# Patient Record
Sex: Female | Born: 1990 | Race: Black or African American | Hispanic: No | Marital: Married | Smoking: Never smoker
Health system: Southern US, Community
[De-identification: ages and names within clinical notes are randomized; demographics above are authoritative.]

## PROBLEM LIST (undated history)

## (undated) ENCOUNTER — Inpatient Hospital Stay: Payer: Self-pay

## (undated) DIAGNOSIS — N289 Disorder of kidney and ureter, unspecified: Secondary | ICD-10-CM

## (undated) DIAGNOSIS — I10 Essential (primary) hypertension: Secondary | ICD-10-CM

## (undated) HISTORY — PX: BLADDER SURGERY: SHX569

---
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
## 2012-01-01 ENCOUNTER — Inpatient Hospital Stay: Payer: Self-pay | Admitting: Obstetrics and Gynecology

## 2012-01-01 LAB — CBC WITH DIFFERENTIAL/PLATELET
Basophil %: 0.2 %
Eosinophil #: 0.1 10*3/uL (ref 0.0–0.7)
Eosinophil %: 1 %
HGB: 9.4 g/dL — ABNORMAL LOW (ref 12.0–16.0)
MCH: 28.1 pg (ref 26.0–34.0)
MCHC: 32.9 g/dL (ref 32.0–36.0)
MCV: 85 fL (ref 80–100)
Monocyte #: 0.7 10*3/uL (ref 0.0–0.7)
Monocyte %: 13.3 %
Neutrophil #: 3.2 10*3/uL (ref 1.4–6.5)
Neutrophil %: 63.1 %
RBC: 3.35 10*6/uL — ABNORMAL LOW (ref 3.80–5.20)

## 2012-04-21 ENCOUNTER — Emergency Department: Payer: Self-pay | Admitting: Emergency Medicine

## 2012-09-04 IMAGING — US US OB < 14 WEEKS
1 series · 17 of 28 positions shown · non-contrast
Comparison: none

REASON FOR EXAM: abdominal pain, vaginal bleeding
COMMENTS:

PROCEDURE:     US  - US OB LESS THAN 14 WEEKS  - June 02, 2011  [DATE]
RESULT:     First trimester OB ultrasound dated 06/02/2011.
Procedure: Real-time sonographic imaging the pelvis was obtained.

[Series 1: us ob < 14 weeks · 17 of 34 slices shown]
[im 1/34]
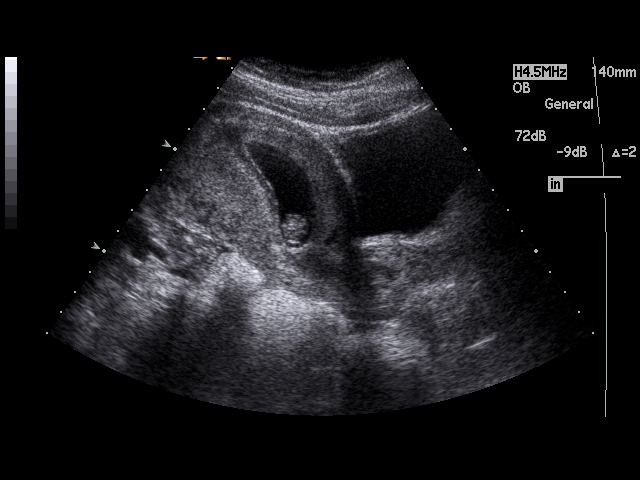
[im 3/34]
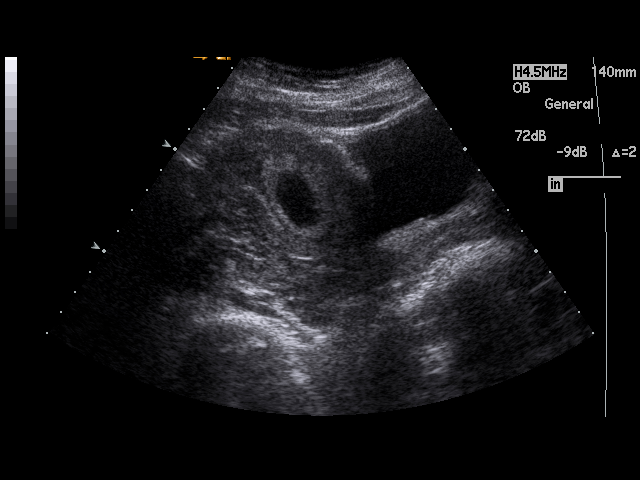
[im 5/34]
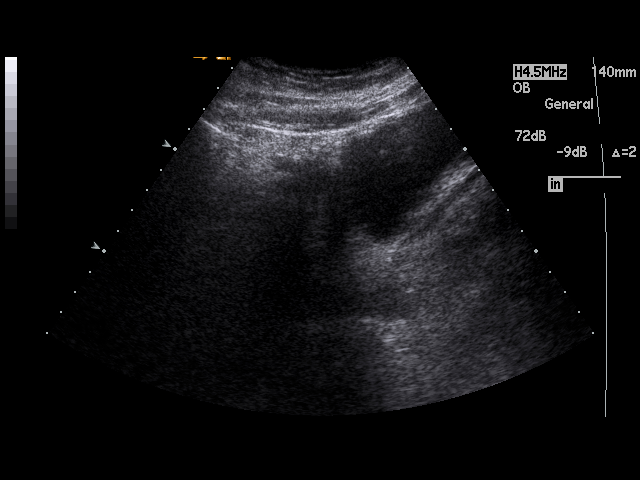
[im 7/34]
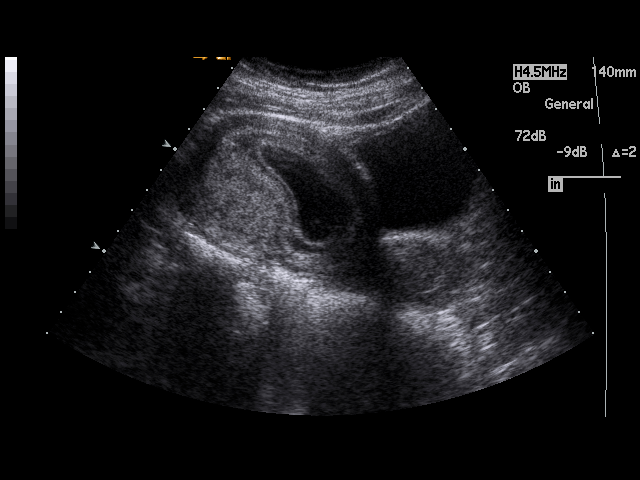
[im 9/34]
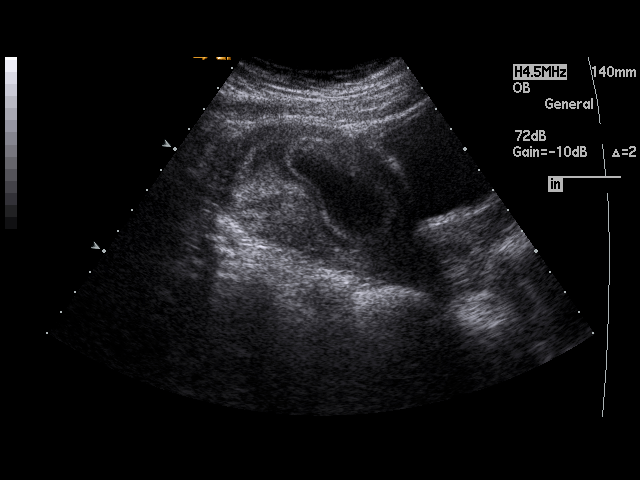
[im 12/34]
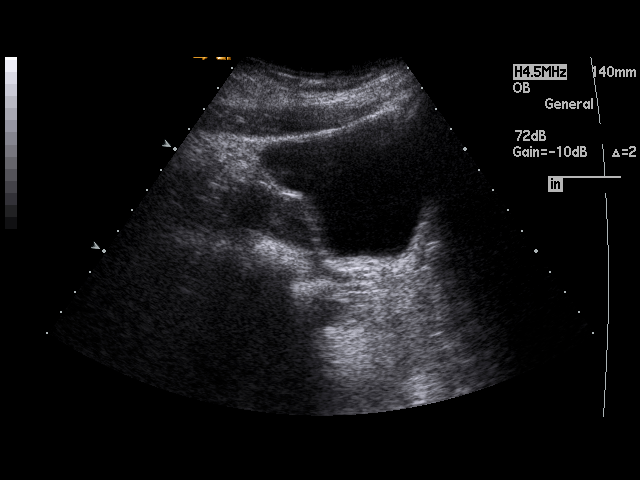
[im 13/34]
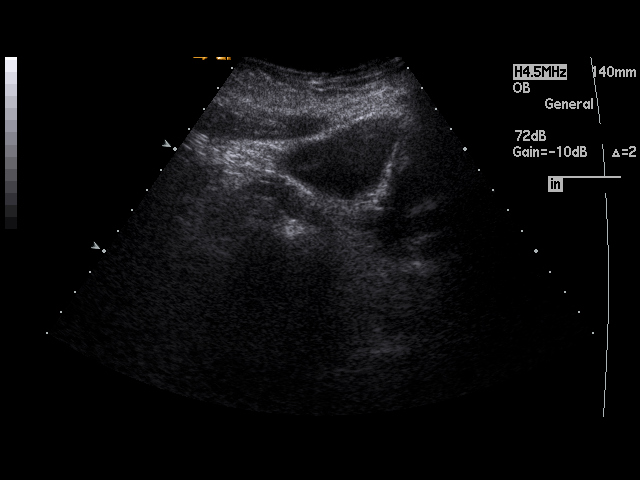
[im 15/34]
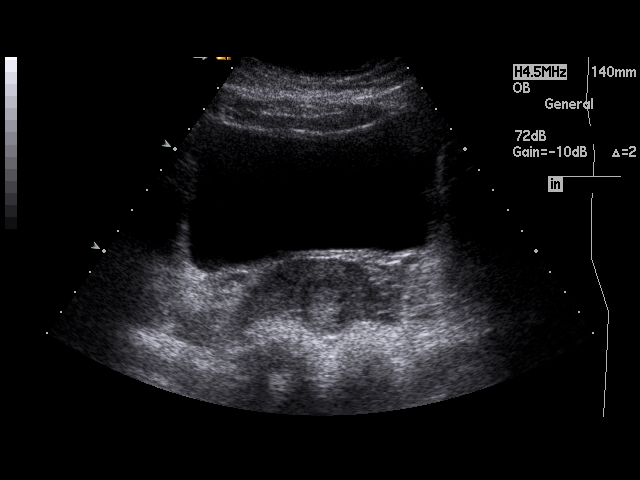
[im 18/34]
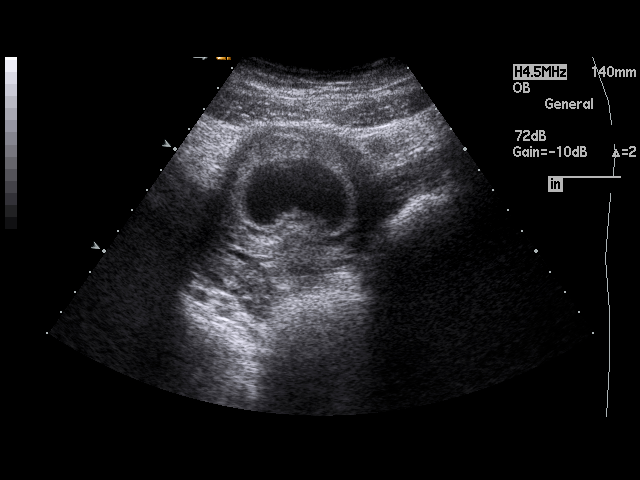
[im 19/34]
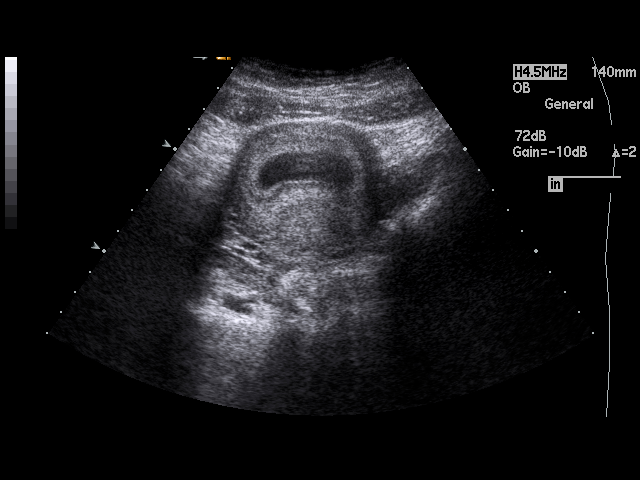
[im 21/34]
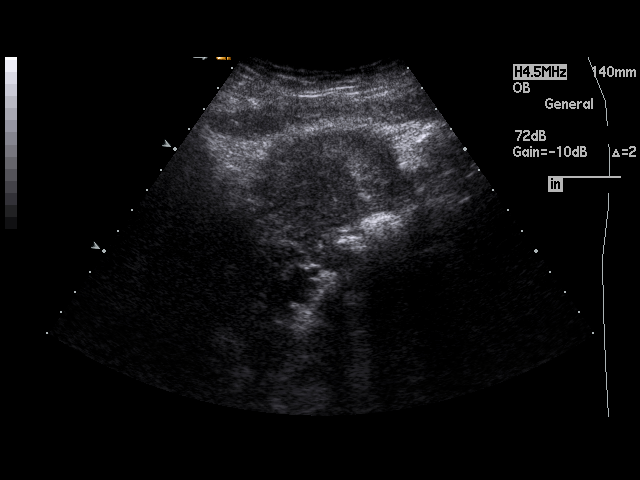
[im 23/34]
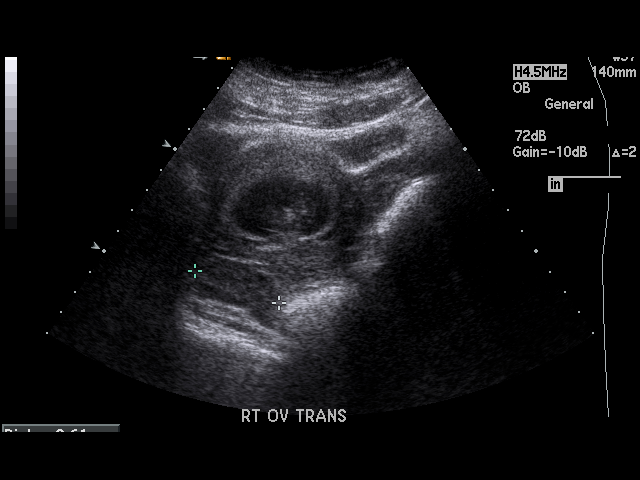
[im 25/34]
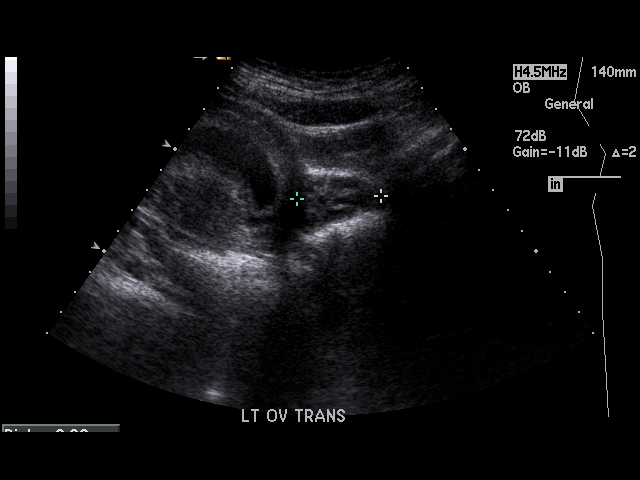
[im 27/34]
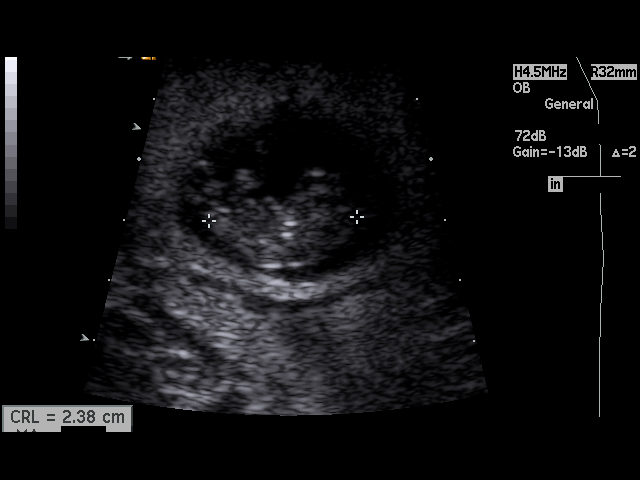
[im 29/34]
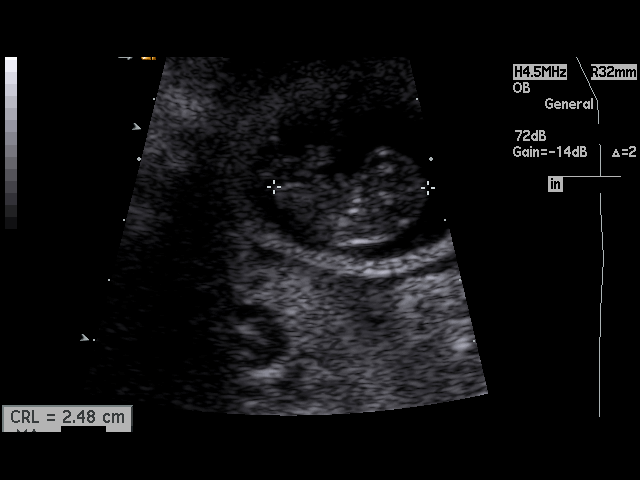
[im 31/34]
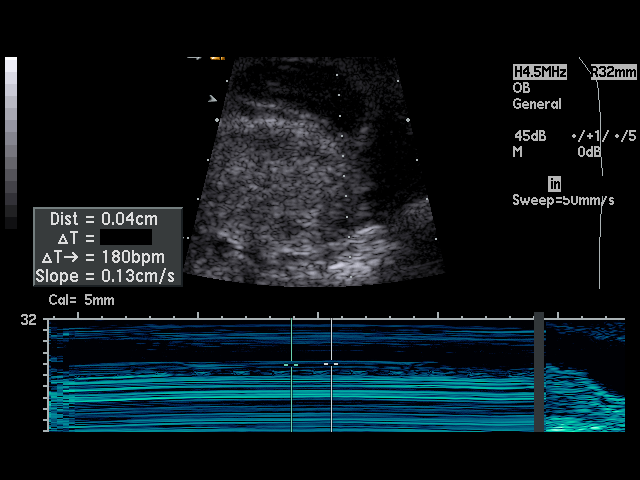
[im 34/34]
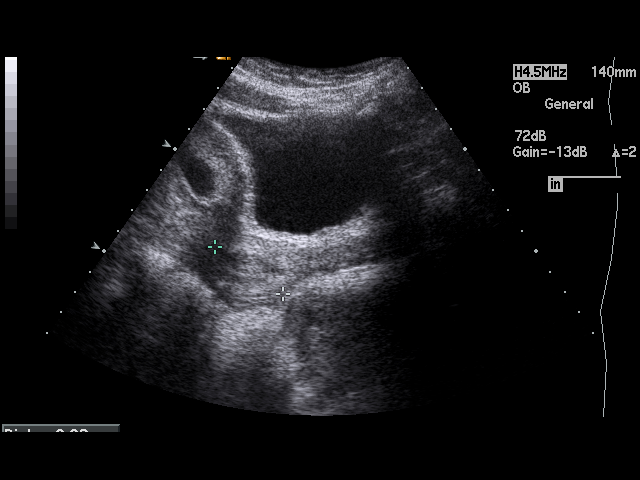

[17 of 28 positions shown; findings below may reference images not displayed]

FINDINGS: A single viable intrauterine pregnancy is identified within
estimated gestational age 9 weeks one day based upon crown-rump length of 2.
45 cm. Estimated fetal heart rate is 180 beats per minute. Yolk sacs
identified and appears unremarkable. The uterus otherwise demonstrates a
homogeneous echotexture. The right and left ovaries are unremarkable
demonstrating homogeneous echotexture. There is no evidence of pelvic free
fluid or loculated fluid collections.
IMPRESSION: Single viable intrauterine pregnancy as described above. No
sonographic and amount is identified.

## 2013-09-21 ENCOUNTER — Emergency Department: Payer: Self-pay | Admitting: Emergency Medicine

## 2013-09-21 LAB — COMPREHENSIVE METABOLIC PANEL
Albumin: 3.9 g/dL (ref 3.4–5.0)
Alkaline Phosphatase: 69 U/L
Anion Gap: 8 (ref 7–16)
Bilirubin,Total: 0.5 mg/dL (ref 0.2–1.0)
Calcium, Total: 9.2 mg/dL (ref 8.5–10.1)
Chloride: 103 mmol/L (ref 98–107)
EGFR (African American): >60
Glucose: 110 mg/dL — ABNORMAL HIGH (ref 65–99)
SGOT(AST): 39 U/L — ABNORMAL HIGH (ref 15–37)
SGPT (ALT): 40 U/L (ref 12–78)
Sodium: 138 mmol/L (ref 136–145)
Total Protein: 7.9 g/dL (ref 6.4–8.2)

## 2013-09-21 LAB — URINALYSIS, COMPLETE
Bacteria: NONE SEEN
Bilirubin,UR: NEGATIVE
Glucose,UR: NEGATIVE mg/dL (ref 0–75)
Nitrite: NEGATIVE
Ph: 6 (ref 4.5–8.0)
RBC,UR: 2 /HPF (ref 0–5)
Squamous Epithelial: 9
WBC UR: 27 /HPF (ref 0–5)

## 2013-09-21 LAB — CBC WITH DIFFERENTIAL/PLATELET
Basophil #: 0 10*3/uL (ref 0.0–0.1)
Basophil %: 0.2 %
Eosinophil #: 0.1 10*3/uL (ref 0.0–0.7)
Eosinophil %: 1.6 %
HCT: 36.8 % (ref 35.0–47.0)
Lymphocyte #: 1.1 10*3/uL (ref 1.0–3.6)
Lymphocyte %: 12.7 %
MCHC: 33 g/dL (ref 32.0–36.0)
MCV: 81 fL (ref 80–100)
Monocyte #: 0.6 x10 3/mm (ref 0.2–0.9)
Monocyte %: 7.6 %
Neutrophil #: 6.6 10*3/uL — ABNORMAL HIGH (ref 1.4–6.5)
Neutrophil %: 77.9 %
RBC: 4.56 10*6/uL (ref 3.80–5.20)
RDW: 15.3 % — ABNORMAL HIGH (ref 11.5–14.5)

## 2014-02-14 ENCOUNTER — Encounter: Payer: Self-pay | Admitting: General Surgery

## 2014-02-14 ENCOUNTER — Ambulatory Visit (INDEPENDENT_AMBULATORY_CARE_PROVIDER_SITE_OTHER): Payer: Medicaid Other | Admitting: General Surgery

## 2014-02-14 ENCOUNTER — Other Ambulatory Visit: Payer: Medicaid Other

## 2014-02-14 VITALS — BP 130/80 | HR 72 | Resp 14 | Ht 67.0 in | Wt 195.0 lb

## 2014-02-14 DIAGNOSIS — M79603 Pain in arm, unspecified: Secondary | ICD-10-CM

## 2014-02-14 DIAGNOSIS — M79609 Pain in unspecified limb: Secondary | ICD-10-CM

## 2014-02-14 NOTE — Progress Notes (Signed)
Patient ID: Crystal Patel, female   DOB: 05/04/1991, 23 y.o.   MRN: [id number]  Chief Complaint  Patient presents with  . Other    Nexplanon left arm     HPI Crystal Patel is a 23 y.o. female here today for a evaluation of removal of Nexplanon implant in left arm. No attempt has been made by her physician to remove this. The patient was referred by NP Lyndel PleasureErica Howard who felt that the implant was placed very deeply. Patient reports some pressure with the left arm. She has had her implant since July 2013. This was placed by Dr Patton SallesWeaver Lee two years ago. The wound has not had an additional unplanned pregnancy, but her family reports moodiness, attributed to the long-term birth control implant. The patient reports that she was never able to feel the implant after implantation.  The young woman reports no pain or discomfort in the upper extremity. No history of swelling or difficulty with motor function.  HPI  No past medical history on file.  No past surgical history on file.  No family history on file.  Social History History  Substance Use Topics  . Smoking status: Never Smoker   . Smokeless tobacco: Not on file  . Alcohol Use: No    No Known Allergies  Current Outpatient Prescriptions  Medication Sig Dispense Refill  . etonogestrel (IMPLANON) 68 MG IMPL implant Inject 1 each into the skin once.       No current facility-administered medications for this visit.    Review of Systems Review of Systems  Constitutional: Negative.   Respiratory: Negative.   Cardiovascular: Negative.     Blood pressure 130/80, pulse 72, resp. rate 14, height 5\' 7"  (1.702 m), weight 195 lb (88.451 kg), last menstrual period 12/22/2013.  Physical Exam Physical Exam  Constitutional: She is oriented to person, place, and time. She appears well-developed and well-nourished.  Neurological: She is alert and oriented to person, place, and time.  Skin:     No palpable abnormality of the upper extremity on  the left side is appreciated. A possible small implant site in the distal third of the upper arm showed no discernible thickening or palpable device.    Data Reviewed GYN procedure note and office notes.  Assessment    Lost implant.     Plan    The area of the left upper inner arm was carefully scant for the implant. Scanning was completed from the medial aspect of the upper arm to the upper third with no discernible architectural distortion appreciated. Normal venous anatomy was noted.   As a young woman is asymptomatic in regards to the implant itself, and it is unable to be identified on ultrasound, I would recommend observation alone. Based on review of the literature this implant will exhaust its medical component in the next 12 months. At that time she should discuss with her GYN provider options for management to prevent another unplanned pregnancy. The patient's mother was present for the interview and exam. At this time for an area that is asymptomatic except for "moodiness" I don't think additional imaging studies to identify the area or blind exploration is indicated.      Ref. MD: Lyndel PleasureErica Howard NP/Dr.Jackson    Merrily PewJeffrey W Yovani Cogburn 02/14/2014, 7:50 PM

## 2014-02-14 NOTE — Patient Instructions (Signed)
Call back with any problems

## 2014-08-22 ENCOUNTER — Encounter: Payer: Self-pay | Admitting: General Surgery

## 2015-02-28 NOTE — H&P (Signed)
L&D Evaluation:  History Expanded:   HPI 24 yo G1 with EDD of 12/31/11 per LMP. 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  Pt presents today at 37 weeks 4 days, c/o noting two small spots of vaginal bleeding this am at 0930. Had several episodes of vomiting yesterday and 1 episode this am. Denies regular ctx or SROM, good fetal movement. Labs: A negative, RI, VNI, HBsAg neg, RPR NR, HIV neg, GC/CT neg, GBS unknown    Patient's Medical History Hypertension    Patient's Surgical History bladder surgery    Medications Pre Natal Vitamins    Allergies NKDA    Social History none   Exam:   Vital Signs stable    General no apparent distress    Mental Status clear    Abdomen gravid, tender with contractions    Estimated Fetal Weight Average for gestational age    Fetal Position vertex    Edema no edema    Pelvic no external lesions, cervix closed and thick, no bleeding noted on perin    Mebranes Intact    FHT normal rate with no decels    Ucx irregular   Impression:   Impression Nausea/vomiting in pregnancy, no signs of vaginal bleeding   Plan:   Comments Give po Zofran and crackers Rx for Zofran reviewed labor and bleeding precautions   Electronic Signatures: Vella KohlerBrothers, [place] Freyre K (CNM)  (Signed 23-Feb-13 14:01)  Authored: L&D Evaluation   Last Updated: 23-Feb-13 14:01 by Vella KohlerBrothers, Zoriana Oats K (CNM)

## 2015-12-08 ENCOUNTER — Emergency Department
Admission: EM | Admit: 2015-12-08 | Discharge: 2015-12-09 | Disposition: A | Discharge status: Home / Self Care | Payer: BLUE CROSS/BLUE SHIELD | Attending: Emergency Medicine | Admitting: Emergency Medicine

## 2015-12-08 ENCOUNTER — Encounter: Payer: Self-pay | Admitting: Emergency Medicine

## 2015-12-08 DIAGNOSIS — I10 Essential (primary) hypertension: Secondary | ICD-10-CM | POA: Diagnosis not present

## 2015-12-08 DIAGNOSIS — R112 Nausea with vomiting, unspecified: Secondary | ICD-10-CM | POA: Insufficient documentation

## 2015-12-08 DIAGNOSIS — Z3202 Encounter for pregnancy test, result negative: Secondary | ICD-10-CM | POA: Diagnosis not present

## 2015-12-08 DIAGNOSIS — R197 Diarrhea, unspecified: Secondary | ICD-10-CM

## 2015-12-08 DIAGNOSIS — N390 Urinary tract infection, site not specified: Secondary | ICD-10-CM | POA: Diagnosis not present

## 2015-12-08 HISTORY — DX: Essential (primary) hypertension: I10

## 2015-12-08 LAB — COMPREHENSIVE METABOLIC PANEL
ALBUMIN: 4.3 g/dL (ref 3.5–5.0)
ALT: 30 U/L (ref 14–54)
ANION GAP: 8 (ref 5–15)
AST: 30 U/L (ref 15–41)
Alkaline Phosphatase: 60 U/L (ref 38–126)
BUN: 11 mg/dL (ref 6–20)
CHLORIDE: 104 mmol/L (ref 101–111)
CO2: 26 mmol/L (ref 22–32)
Calcium: 9.1 mg/dL (ref 8.9–10.3)
Creatinine, Ser: 0.71 mg/dL (ref 0.44–1.00)
GFR calc Af Amer: >60 mL/min (ref >60)
GFR calc non Af Amer: >60 mL/min (ref >60)
GLUCOSE: 96 mg/dL (ref 65–99)
POTASSIUM: 4 mmol/L (ref 3.5–5.1)
SODIUM: 138 mmol/L (ref 135–145)
TOTAL PROTEIN: 7.9 g/dL (ref 6.5–8.1)
Total Bilirubin: 0.6 mg/dL (ref 0.3–1.2)

## 2015-12-08 LAB — URINALYSIS COMPLETE WITH MICROSCOPIC (ARMC ONLY)
Bilirubin Urine: NEGATIVE
Glucose, UA: NEGATIVE mg/dL
HGB URINE DIPSTICK: NEGATIVE
Ketones, ur: NEGATIVE mg/dL
NITRITE: NEGATIVE
PROTEIN: NEGATIVE mg/dL
SPECIFIC GRAVITY, URINE: 1.023 (ref 1.005–1.030)
pH: 5 (ref 5.0–8.0)

## 2015-12-08 LAB — POCT PREGNANCY, URINE: PREG TEST UR: NEGATIVE

## 2015-12-08 LAB — CBC
HEMATOCRIT: 39.9 % (ref 35.0–47.0)
HEMOGLOBIN: 13.5 g/dL (ref 12.0–16.0)
MCH: 29 pg (ref 26.0–34.0)
MCHC: 33.9 g/dL (ref 32.0–36.0)
MCV: 85.5 fL (ref 80.0–100.0)
Platelets: 192 10*3/uL (ref 150–440)
RBC: 4.67 MIL/uL (ref 3.80–5.20)
RDW: 12.7 % (ref 11.5–14.5)
WBC: 8 10*3/uL (ref 3.6–11.0)

## 2015-12-08 MED ORDER — ONDANSETRON 4 MG PO TBDP
4.0000 mg | ORAL_TABLET | Freq: Once | ORAL | Status: AC | PRN
Start: 2015-12-08 — End: 2015-12-08
  Administered 2015-12-08: 4 mg via ORAL

## 2015-12-08 MED ORDER — ONDANSETRON 4 MG PO TBDP
ORAL_TABLET | ORAL | Status: AC
  Filled 2015-12-08: qty 1

## 2015-12-08 NOTE — ED Notes (Signed)
Pt states vomiting and diarrhea with abdominal pain that began at 1530 today. Pt states other sick contacts at work.

## 2015-12-09 MED ORDER — ONDANSETRON HCL 4 MG/2ML IJ SOLN
INTRAMUSCULAR | Status: AC
  Administered 2015-12-09: 4 mg via INTRAVENOUS
  Filled 2015-12-09: qty 2

## 2015-12-09 MED ORDER — ONDANSETRON 4 MG PO TBDP: 4.0000 mg | ORAL_TABLET | Freq: Three times a day (TID) | ORAL | Status: DC | PRN

## 2015-12-09 MED ORDER — SODIUM CHLORIDE 0.9 % IV BOLUS (SEPSIS)
1000.0000 mL | Freq: Once | INTRAVENOUS | Status: AC
  Administered 2015-12-09: 1000 mL via INTRAVENOUS

## 2015-12-09 MED ORDER — CIPROFLOXACIN HCL 500 MG PO TABS: 500.0000 mg | ORAL_TABLET | Freq: Two times a day (BID) | ORAL | Status: AC

## 2015-12-09 MED ORDER — ONDANSETRON HCL 4 MG/2ML IJ SOLN
4.0000 mg | Freq: Once | INTRAMUSCULAR | Status: AC
  Administered 2015-12-09: 4 mg via INTRAVENOUS

## 2015-12-09 NOTE — Discharge Instructions (Signed)

## 2015-12-09 NOTE — ED Provider Notes (Signed)
[facility] Emergency Department Provider Note  ____________________________________________  Time seen: 1:45 AM  I have reviewed the triage vital signs and the nursing notes.   HISTORY  Chief Complaint Emesis     HPI Crystal Patel is a 25 y.o. female presents with vomiting and diarrhea accompanied by abdominal cramps with onset at 1:30 PM yesterday afternoon. Patient admits to sick contacts at work with the same symptoms. Patient denies any fever. Patient denies any abdominal pain at this time. Patient denies any urinary symptoms    Past Medical History  Diagnosis Date  . Hypertension     Patient Active Problem List   Diagnosis Date Noted  . Arm pain 02/14/2014    Past Surgical History  Procedure Laterality Date  . Bladder surgery      Current Outpatient Rx  Name  Route  Sig  Dispense  Refill  . etonogestrel (IMPLANON) 68 MG IMPL implant   Subcutaneous   Inject 1 each into the skin once.         . ondansetron (ZOFRAN-ODT) 4 MG disintegrating tablet   Oral   Take 1 tablet (4 mg total) by mouth every 8 (eight) hours as needed for nausea.   20 tablet   0     Allergies No known drug allergies No family history on file.  Social History Social History  Substance Use Topics  . Smoking status: Never Smoker   . Smokeless tobacco: None  . Alcohol Use: No    Review of Systems  Constitutional: Negative for fever. Eyes: Negative for visual changes. ENT: Negative for sore throat. Cardiovascular: Negative for chest pain. Respiratory: Negative for shortness of breath. Gastrointestinal: Positive for vomiting and diarrhea Genitourinary: Negative for dysuria. Musculoskeletal: Negative for back pain. Skin: Negative for rash. Neurological: Negative for headaches, focal weakness or numbness.   10-point ROS otherwise negative.  ____________________________________________   PHYSICAL EXAM:  VITAL SIGNS: ED Triage Vitals  Enc  Vitals Group     BP 12/08/15 2138 139/81 mmHg     Pulse Rate 12/08/15 2138 90     Resp 12/08/15 2136 16     Temp 12/08/15 2138 98 F (36.7 C)     Temp Source 12/08/15 2138 Oral     SpO2 12/08/15 2138 100 %     Weight 12/08/15 2136 200 lb (90.719 kg)     Height 12/08/15 2136  (1.727 m)     Head Cir --      Peak Flow --      Pain Score 12/08/15 2136 5     Pain Loc --      Pain Edu? --      Excl. in GC? --      Constitutional: Alert and oriented. Well appearing and in no distress. Eyes: Conjunctivae are normal. PERRL. Normal extraocular movements. ENT   Head: Normocephalic and atraumatic.   Nose: No congestion/rhinnorhea.   Mouth/Throat: Mucous membranes are moist.   Neck: No stridor. Hematological/Lymphatic/Immunilogical: No cervical lymphadenopathy. Cardiovascular: Normal rate, regular rhythm. Normal and symmetric distal pulses are present in all extremities. No murmurs, rubs, or gallops. Respiratory: Normal respiratory effort without tachypnea nor retractions. Breath sounds are clear and equal bilaterally. No wheezes/rales/rhonchi. Gastrointestinal: Soft and nontender. No distention. There is no CVA tenderness. Genitourinary: deferred Musculoskeletal: Nontender with normal range of motion in all extremities. No joint effusions.  No lower extremity tenderness nor edema. Neurologic:  Normal speech and language. No gross focal neurologic deficits are appreciated. Speech is  normal.  Skin:  Skin is warm, dry and intact. No rash noted. Psychiatric: Mood and affect are normal. Speech and behavior are normal. Patient exhibits appropriate insight and judgment.  ____________________________________________    LABS (pertinent positives/negatives)  Labs Reviewed  URINALYSIS COMPLETEWITH MICROSCOPIC ([facility] ONLY) - Abnormal; Notable for the following:    Color, Urine YELLOW (*)    APPearance HAZY (*)    Leukocytes, UA 1+ (*)    Bacteria, UA RARE (*)    Squamous  Epithelial / LPF 6-30 (*)    All other components within normal limits  COMPREHENSIVE METABOLIC PANEL  CBC  POC URINE PREG, ED  POCT PREGNANCY, URINE        INITIAL IMPRESSION / ASSESSMENT AND PLAN / ED COURSE  Pertinent labs & imaging results that were available during my care of the patient were reviewed by me and considered in my medical decision making (see chart for details).  Urinalysis consistent with possible UTI. Patient will likely viral etiology for vomiting and diarrhea given sick contacts at same. Patient received IV Zofran 4 mg by mouth as well as 4 mg IV in the emergency department as well as 1 L normal saline IV  ____________________________________________   FINAL CLINICAL IMPRESSION(S) / ED DIAGNOSES  Final diagnoses:  Non-intractable vomiting with nausea, vomiting of unspecified type  Diarrhea, unspecified type  UTI (lower urinary tract infection)      Darci Current, MD 12/09/15 [phone]

## 2015-12-09 NOTE — ED Notes (Signed)
Resumed care from grace rn.  Pt alert. Iv infused.  Pt denies pain.  Family with pt.

## 2016-06-29 ENCOUNTER — Emergency Department
Admission: EM | Admit: 2016-06-29 | Discharge: 2016-06-29 | Disposition: A | Discharge status: Home / Self Care | Payer: BLUE CROSS/BLUE SHIELD | Attending: Emergency Medicine | Admitting: Emergency Medicine

## 2016-06-29 ENCOUNTER — Encounter: Payer: Self-pay | Admitting: *Deleted

## 2016-06-29 DIAGNOSIS — Z3A01 Less than 8 weeks gestation of pregnancy: Secondary | ICD-10-CM | POA: Insufficient documentation

## 2016-06-29 DIAGNOSIS — O219 Vomiting of pregnancy, unspecified: Secondary | ICD-10-CM | POA: Insufficient documentation

## 2016-06-29 DIAGNOSIS — I10 Essential (primary) hypertension: Secondary | ICD-10-CM | POA: Insufficient documentation

## 2016-06-29 DIAGNOSIS — O210 Mild hyperemesis gravidarum: Secondary | ICD-10-CM

## 2016-06-29 LAB — COMPREHENSIVE METABOLIC PANEL
ALK PHOS: 43 U/L (ref 38–126)
ALT: 22 U/L (ref 14–54)
ANION GAP: 6 (ref 5–15)
AST: 29 U/L (ref 15–41)
Albumin: 4.2 g/dL (ref 3.5–5.0)
BILIRUBIN TOTAL: 0.4 mg/dL (ref 0.3–1.2)
BUN: 9 mg/dL (ref 6–20)
CALCIUM: 9.6 mg/dL (ref 8.9–10.3)
CO2: 26 mmol/L (ref 22–32)
Chloride: 104 mmol/L (ref 101–111)
Creatinine, Ser: 0.73 mg/dL (ref 0.44–1.00)
GFR calc non Af Amer: >60 mL/min (ref >60)
Glucose, Bld: 117 mg/dL — ABNORMAL HIGH (ref 65–99)
Potassium: 3.7 mmol/L (ref 3.5–5.1)
Sodium: 136 mmol/L (ref 135–145)
TOTAL PROTEIN: 8.1 g/dL (ref 6.5–8.1)

## 2016-06-29 LAB — URINALYSIS COMPLETE WITH MICROSCOPIC (ARMC ONLY)
Bilirubin Urine: NEGATIVE
Glucose, UA: NEGATIVE mg/dL
Hgb urine dipstick: NEGATIVE
KETONES UR: NEGATIVE mg/dL
Leukocytes, UA: NEGATIVE
NITRITE: NEGATIVE
PROTEIN: NEGATIVE mg/dL
SPECIFIC GRAVITY, URINE: 1.008 (ref 1.005–1.030)
pH: 6 (ref 5.0–8.0)

## 2016-06-29 LAB — CBC
HCT: 37 % (ref 35.0–47.0)
HEMOGLOBIN: 12.7 g/dL (ref 12.0–16.0)
MCH: 29.7 pg (ref 26.0–34.0)
MCHC: 34.4 g/dL (ref 32.0–36.0)
MCV: 86.4 fL (ref 80.0–100.0)
Platelets: 217 10*3/uL (ref 150–440)
RBC: 4.29 MIL/uL (ref 3.80–5.20)
RDW: 13.2 % (ref 11.5–14.5)
WBC: 6.2 10*3/uL (ref 3.6–11.0)

## 2016-06-29 LAB — HCG, QUANTITATIVE, PREGNANCY: hCG, Beta Chain, Quant, S: 5343 m[IU]/mL — ABNORMAL HIGH (ref <5)

## 2016-06-29 LAB — POCT PREGNANCY, URINE: Preg Test, Ur: POSITIVE — AB

## 2016-06-29 MED ORDER — ONDANSETRON 4 MG PO TBDP: 4.0000 mg | ORAL_TABLET | Freq: Three times a day (TID) | ORAL | 0 refills | Status: AC | PRN

## 2016-06-29 NOTE — ED Triage Notes (Signed)
Pt reports she has nausea for 1 day.  Pt had positive home pregnancy last week.  No abd pain. No vag bleeding   No dysuria.  Pt alert.

## 2016-06-29 NOTE — ED Notes (Signed)
Pt states that she would like to know a due date on her pregnancy. Pt here for nausea x1 week.

## 2016-06-29 NOTE — Discharge Instructions (Signed)
Estimated due date: 02/21/2017

## 2016-06-29 NOTE — ED Provider Notes (Signed)
[facility] Emergency Department Provider Note  Time seen: 9:17 PM  I have reviewed the triage vital signs and the nursing notes.   HISTORY  Chief Complaint Morning Sickness    HPI Crystal Patel Crystal Patel is a 25 y.o. female with a past medical history of hypertension who presents the emergency department with nausea and pregnancy. According to the patient she is pregnant with a last menstrual period of July 28, presents with intermittent nausea and vomiting over the past 2 weeks. According to the patient she is nauseated most days but only vomits occasionally. She came to the emergency department for evaluation of the nausea, but also to find out what her due date was. Patient states her last period started July 28.Denies any abdominal pain. Denies vaginal bleeding or discharge.  Past Medical History:  Diagnosis Date  . Hypertension     Patient Active Problem List   Diagnosis Date Noted  . Arm pain 02/14/2014    Past Surgical History:  Procedure Laterality Date  . BLADDER SURGERY      Prior to Admission medications   Medication Sig Start Date End Date Taking? Authorizing Provider  etonogestrel (IMPLANON) 68 MG IMPL implant Inject 1 each into the skin once.    Historical Provider, MD  ondansetron (ZOFRAN-ODT) 4 MG disintegrating tablet Take 1 tablet (4 mg total) by mouth every 8 (eight) hours as needed for nausea. 12/09/15   Darci Currentandolph N Brown, MD    No Known Allergies  No family history on file.  Social History Social History  Substance Use Topics  . Smoking status: Never Smoker  . Smokeless tobacco: Never Used  . Alcohol use No    Review of Systems Constitutional: Negative for fever. Cardiovascular: Negative for chest pain. Respiratory: Negative for shortness of breath. Gastrointestinal: Negative for abdominal pain. Positive for nausea and vomiting. Negative for diarrhea. Genitourinary: Negative for dysuria. Neurological: Negative for  headache 10-point ROS otherwise negative.  ____________________________________________   PHYSICAL EXAM:  VITAL SIGNS: ED Triage Vitals [06/29/16 1753]  Enc Vitals Group     BP (!) 141/75     Pulse Rate 85     Resp 18     Temp 98.2 F (36.8 C)     Temp Source Oral     SpO2 98 %     Weight 211 lb (95.7 kg)     Height 5\' 7"  (1.702 m)     Head Circumference      Peak Flow      Pain Score      Pain Loc      Pain Edu?      Excl. in GC?     Constitutional: Alert and oriented. Well appearing and in no distress. Eyes: Normal exam ENT   Head: Normocephalic and atraumatic.   Mouth/Throat: Mucous membranes are moist. Cardiovascular: Normal rate, regular rhythm. No murmur Respiratory: Normal respiratory effort without tachypnea nor retractions. Breath sounds are clear Gastrointestinal: Soft and nontender. No distention.   Musculoskeletal: Nontender with normal range of motion in all extremities. Neurologic:  Normal speech and language. No gross focal neurologic deficits Skin:  Skin is warm, dry and intact.  Psychiatric: Mood and affect are normal.   ____________________________________________   INITIAL IMPRESSION / ASSESSMENT AND PLAN / ED COURSE  Pertinent labs & imaging results that were available during my care of the patient were reviewed by me and considered in my medical decision making (see chart for details).  The patient presents to the emergency  department with intermittent nausea and vomiting, trying to find out what her due date is. Patient's last period started July 28, which would place her due date at 02/21/2017. Overall the patient appears well, labs are largely within normal limits, nontender abdominal exam. I discussed the pros and cons including cleft lip/cleft palate risk of Zofran, patient is agreeable to Zofran prescription which she will take sparingly. She will call to arrange an OB appointment as soon as  possible.  ____________________________________________   FINAL CLINICAL IMPRESSION(S) / ED DIAGNOSES  Morning sickness    [name], MD 06/29/16 2120

## 2016-07-11 ENCOUNTER — Emergency Department
Admission: EM | Admit: 2016-07-11 | Discharge: 2016-07-11 | Disposition: A | Discharge status: Home / Self Care | Payer: BLUE CROSS/BLUE SHIELD | Attending: Emergency Medicine | Admitting: Emergency Medicine

## 2016-07-11 ENCOUNTER — Emergency Department: Payer: BLUE CROSS/BLUE SHIELD

## 2016-07-11 ENCOUNTER — Encounter: Payer: Self-pay | Admitting: Emergency Medicine

## 2016-07-11 DIAGNOSIS — O209 Hemorrhage in early pregnancy, unspecified: Secondary | ICD-10-CM | POA: Diagnosis not present

## 2016-07-11 DIAGNOSIS — Z3A01 Less than 8 weeks gestation of pregnancy: Secondary | ICD-10-CM | POA: Insufficient documentation

## 2016-07-11 DIAGNOSIS — I10 Essential (primary) hypertension: Secondary | ICD-10-CM | POA: Insufficient documentation

## 2016-07-11 DIAGNOSIS — N939 Abnormal uterine and vaginal bleeding, unspecified: Secondary | ICD-10-CM | POA: Diagnosis present

## 2016-07-11 LAB — CBC WITH DIFFERENTIAL/PLATELET
BASOS ABS: 0 10*3/uL (ref 0–0.1)
Basophils Relative: 0 %
EOS PCT: 4 %
Eosinophils Absolute: 0.3 10*3/uL (ref 0–0.7)
HEMATOCRIT: 35.1 % (ref 35.0–47.0)
Hemoglobin: 12.2 g/dL (ref 12.0–16.0)
LYMPHS PCT: 30 %
Lymphs Abs: 2 10*3/uL (ref 1.0–3.6)
MCH: 29.9 pg (ref 26.0–34.0)
MCHC: 34.8 g/dL (ref 32.0–36.0)
MCV: 85.8 fL (ref 80.0–100.0)
Monocytes Absolute: 0.9 10*3/uL (ref 0.2–0.9)
Monocytes Relative: 14 %
NEUTROS ABS: 3.4 10*3/uL (ref 1.4–6.5)
Neutrophils Relative %: 52 %
PLATELETS: 211 10*3/uL (ref 150–440)
RBC: 4.09 MIL/uL (ref 3.80–5.20)
RDW: 13.4 % (ref 11.5–14.5)
WBC: 6.5 10*3/uL (ref 3.6–11.0)

## 2016-07-11 LAB — URINALYSIS COMPLETE WITH MICROSCOPIC (ARMC ONLY)
Bacteria, UA: NONE SEEN
Bilirubin Urine: NEGATIVE
Glucose, UA: NEGATIVE mg/dL
HGB URINE DIPSTICK: NEGATIVE
KETONES UR: NEGATIVE mg/dL
LEUKOCYTES UA: NEGATIVE
Nitrite: NEGATIVE
PH: 7 (ref 5.0–8.0)
PROTEIN: NEGATIVE mg/dL
Specific Gravity, Urine: 1.015 (ref 1.005–1.030)

## 2016-07-11 LAB — ABO/RH: ABO/RH(D): A NEG

## 2016-07-11 LAB — HCG, QUANTITATIVE, PREGNANCY: hCG, Beta Chain, Quant, S: 10233 m[IU]/mL — ABNORMAL HIGH (ref <5)

## 2016-07-11 LAB — ANTIBODY SCREEN: Antibody Screen: NEGATIVE

## 2016-07-11 MED ORDER — RHO D IMMUNE GLOBULIN 1500 UNIT/2ML IJ SOSY
50.0000 ug | PREFILLED_SYRINGE | Freq: Once | INTRAMUSCULAR | Status: AC
  Administered 2016-07-11: 49.5 ug via INTRAMUSCULAR
  Filled 2016-07-11: qty 2

## 2016-07-11 NOTE — ED Provider Notes (Signed)
[facility] Emergency Department Provider Note  ____________________________________________   I have reviewed the triage vital signs and the nursing notes.   HISTORY  Chief Complaint Vaginal Bleeding    HPI Crystal Patel is a 25 y.o. female last menstrual period was July 26 making her approximately [redacted] weeks pregnant presents today complaining of spotting. She had trace spotting over the course of the day. Denies dysuria or urinary frequency denies any pelvic pain. Patient was seen here on the ninth with some hyperemesis symptoms, which have resolved. At that time patient wanted an ultrasound.Patient has no other complaints or symptoms.      Past Medical History:  Diagnosis Date  . Hypertension     Patient Active Problem List   Diagnosis Date Noted  . Arm pain 02/14/2014    Past Surgical History:  Procedure Laterality Date  . BLADDER SURGERY      Prior to Admission medications   Medication Sig Start Date End Date Taking? Authorizing Provider  etonogestrel (IMPLANON) 68 MG IMPL implant Inject 1 each into the skin once.    Historical Provider, MD  ondansetron (ZOFRAN ODT) 4 MG disintegrating tablet Take 1 tablet (4 mg total) by mouth every 8 (eight) hours as needed for nausea or vomiting. 06/29/16   Minna AntisKevin Paduchowski, MD    Allergies Review of patient's allergies indicates no known allergies.  History reviewed. No pertinent family history.  Social History Social History  Substance Use Topics  . Smoking status: Never Smoker  . Smokeless tobacco: Never Used  . Alcohol use No    Review of Systems Constitutional: No fever/chills Eyes: No visual changes. ENT: No sore throat. No stiff neck no neck pain Cardiovascular: Denies chest pain. Respiratory: Denies shortness of breath. Gastrointestinal:   no vomiting.  No diarrhea.  No constipation. Genitourinary: Negative for dysuria. Musculoskeletal: Negative lower extremity swelling Skin:  Negative for rash. Neurological: Negative for severe headaches, focal weakness or numbness. 10-point ROS otherwise negative.  ____________________________________________   PHYSICAL EXAM:  VITAL SIGNS: ED Triage Vitals [07/11/16 1815]  Enc Vitals Group     BP (!) 143/69     Pulse Rate 78     Resp 18     Temp 98.2 F (36.8 C)     Temp Source Oral     SpO2 100 %     Weight 213 lb (96.6 kg)     Height 5\' 8"  (1.727 m)     Head Circumference      Peak Flow      Pain Score      Pain Loc      Pain Edu?      Excl. in GC?     Constitutional: Alert and oriented. Well appearing and in no acute distress. Eyes: Conjunctivae are normal. PERRL. EOMI. Head: Atraumatic. Nose: No congestion/rhinnorhea. Mouth/Throat: Mucous membranes are moist.  Oropharynx non-erythematous. Cardiovascular: Normal rate, regular rhythm. Grossly normal heart sounds.  Good peripheral circulation. Respiratory: Normal respiratory effort.  No retractions. Lungs CTAB. Abdominal: Soft and nontender. No distention. No guarding no rebound Back:  There is no focal tenderness or step off.  there is no midline tenderness there are no lesions noted. there is no CVA tenderness Pelvic exam: Female nurse chaperone present, no external lesions noted, physiologic vaginal discharge noted with no purulent discharge, no cervical motion tenderness, no adnexal tenderness or mass, there is no significant uterine tenderness or mass. No vaginal bleeding Musculoskeletal: No lower extremity tenderness, no upper extremity tenderness. No  joint effusions, no DVT signs strong distal pulses no edema Neurologic:  Normal speech and language. No gross focal neurologic deficits are appreciated.  Skin:  Skin is warm, dry and intact. No rash noted. Psychiatric: Mood and affect are normal. Speech and behavior are normal.  ____________________________________________   LABS (all labs ordered are listed, but only abnormal results are  displayed)  Labs Reviewed  HCG, QUANTITATIVE, PREGNANCY - Abnormal; Notable for the following:       Result Value   hCG, Beta Chain, Quant, S 10,233 (*)    All other components within normal limits  CBC WITH DIFFERENTIAL/PLATELET  URINALYSIS COMPLETEWITH MICROSCOPIC ([facility] ONLY)  ABO/RH  ANTIBODY SCREEN  RHOGAM INJECTION   ____________________________________________  EKG  I personally interpreted any EKGs ordered by me or triage  ____________________________________________  RADIOLOGY  I reviewed any imaging ordered by me or triage that were performed during my shift and, if possible, patient and/or family made aware of any abnormal findings. ____________________________________________   PROCEDURES  Procedure(s) performed: None  Procedures  Critical Care performed: None  ____________________________________________   INITIAL IMPRESSION / ASSESSMENT AND PLAN / ED COURSE  Pertinent labs & imaging results that were available during my care of the patient were reviewed by me and considered in my medical decision making (see chart for details).  Patient with spotting which I do not see an early pregnancy. Ultrasound shows an early IUP which is not exactly concordant with dates however there is no adnexal tenderness, no significant vaginal bleeding, and no ectopic pregnancy seen on exam. Low suspicion for serious sac therefore. Nonetheless I have strongly advised that she follow up with [facility], her preferred OB in 2 days for further evaluation. Extensive return precautions and follow-up given for any bleeding or abdominal pain as per my customary instructions. I have also ordered program, she has no evidence of ongoing bleeding but she was having spotting she is certainly Rh- and that will be administered as well. At this stage of pregnancy her condition is for 50 g which I have ordered.  Clinical Course   ____________________________________________   FINAL  CLINICAL IMPRESSION(S) / ED DIAGNOSES  Final diagnoses:  None      This chart was dictated using voice recognition software.  Despite best efforts to proofread,  errors can occur which can change meaning.      Jeanmarie Plant, MD 07/11/16 2220

## 2016-07-11 NOTE — ED Triage Notes (Signed)
Pt reports 2 months pregnant and started spotting today at work. No abdominal pain. Has seen doctor. US for IUP verification next week.

## 2016-07-12 LAB — RHOGAM INJECTION: UNIT DIVISION: 0

## 2016-07-17 ENCOUNTER — Emergency Department: Payer: BLUE CROSS/BLUE SHIELD

## 2016-07-17 ENCOUNTER — Encounter: Payer: Self-pay | Admitting: Emergency Medicine

## 2016-07-17 ENCOUNTER — Emergency Department
Admission: EM | Admit: 2016-07-17 | Discharge: 2016-07-17 | Disposition: A | Discharge status: Home / Self Care | Payer: BLUE CROSS/BLUE SHIELD | Attending: Emergency Medicine | Admitting: Emergency Medicine

## 2016-07-17 DIAGNOSIS — Z3A01 Less than 8 weeks gestation of pregnancy: Secondary | ICD-10-CM | POA: Diagnosis not present

## 2016-07-17 DIAGNOSIS — I10 Essential (primary) hypertension: Secondary | ICD-10-CM | POA: Diagnosis not present

## 2016-07-17 DIAGNOSIS — R1030 Lower abdominal pain, unspecified: Secondary | ICD-10-CM | POA: Diagnosis not present

## 2016-07-17 DIAGNOSIS — O209 Hemorrhage in early pregnancy, unspecified: Secondary | ICD-10-CM | POA: Insufficient documentation

## 2016-07-17 LAB — CBC
HCT: 36.8 % (ref 35.0–47.0)
Hemoglobin: 12.6 g/dL (ref 12.0–16.0)
MCH: 29.6 pg (ref 26.0–34.0)
MCHC: 34.4 g/dL (ref 32.0–36.0)
MCV: 86.2 fL (ref 80.0–100.0)
Platelets: 196 10*3/uL (ref 150–440)
RBC: 4.27 MIL/uL (ref 3.80–5.20)
RDW: 12.9 % (ref 11.5–14.5)
WBC: 4.3 10*3/uL (ref 3.6–11.0)

## 2016-07-17 LAB — HCG, QUANTITATIVE, PREGNANCY: hCG, Beta Chain, Quant, S: 13431 m[IU]/mL — ABNORMAL HIGH (ref <5)

## 2016-07-17 NOTE — ED Triage Notes (Addendum)
Pt reports vaginal bleeding only when wiping. Pt is two mths pregnant with mild cramping started yesterday. [facility] and did have a pap smear completed.

## 2016-07-17 NOTE — ED Provider Notes (Signed)
[facility] Emergency Department Provider Note  ____________________________________________  Time seen: Approximately 8:11 AM  I have reviewed the triage vital signs and the nursing notes.   HISTORY  Chief Complaint Vaginal Bleeding   HPI Crystal Patel is a 25 y.o. female G2P1 currently at [redacted] weeks gestational age who presents for evaluation of abdominal cramping and vaginal spotting. Patient was here a week ago and had an ultrasound showing a 5 week intrauterine gestational sac with no embryo or yolk sac visualized. She reports that the vaginal spotting subsided. Yesterday she followed up with OB/GYN and had a Pap smear done. Yesterday evening she started having lower abdominal cramping and started spotting. She reports that she only has blood when she wipes but she has passed a few blood clots. She denies syncope, lightheadedness, chest pain, shortness of breath. She currently has mild lower abdominal cramping that radiates to her back since last night. She tried some Tylenol at home with no relief of the pain.  Past Medical History:  Diagnosis Date  . Hypertension     Patient Active Problem List   Diagnosis Date Noted  . Arm pain 02/14/2014    Past Surgical History:  Procedure Laterality Date  . BLADDER SURGERY      Prior to Admission medications   Medication Sig Start Date End Date Taking? Authorizing Provider  etonogestrel (IMPLANON) 68 MG IMPL implant Inject 1 each into the skin once.    Historical Provider, MD  ondansetron (ZOFRAN ODT) 4 MG disintegrating tablet Take 1 tablet (4 mg total) by mouth every 8 (eight) hours as needed for nausea or vomiting. 06/29/16   [name], MD    Allergies Review of patient's allergies indicates no known allergies.  No family history on file.  Social History Social History  Substance Use Topics  . Smoking status: Never Smoker  . Smokeless tobacco: Never Used  . Alcohol use No    Review of  Systems  Constitutional: Negative for fever. Eyes: Negative for visual changes. ENT: Negative for sore throat. Cardiovascular: Negative for chest pain. Respiratory: Negative for shortness of breath. Gastrointestinal: + lower abdominal cramping. No vomiting or diarrhea. Genitourinary: Negative for dysuria. + vaginal bleeding Musculoskeletal: Negative for back pain. Skin: Negative for rash. Neurological: Negative for headaches, weakness or numbness.  ____________________________________________   PHYSICAL EXAM:  VITAL SIGNS: ED Triage Vitals  Enc Vitals Group     BP 07/17/16 0743 130/80     Pulse Rate 07/17/16 0743 79     Resp 07/17/16 0743 18     Temp 07/17/16 0743 98.2 F (36.8 C)     Temp Source 07/17/16 0743 Oral     SpO2 07/17/16 0743 99 %     Weight 07/17/16 0744 214 lb (97.1 kg)     Height 07/17/16 0744 5\' 7"  (1.702 m)     Head Circumference --      Peak Flow --      Pain Score 07/17/16 0744 2     Pain Loc --      Pain Edu? --      Excl. in GC? --     Constitutional: Alert and oriented. Well appearing and in no apparent distress. HEENT:      Head: Normocephalic and atraumatic.         Eyes: Conjunctivae are normal. Sclera is non-icteric. EOMI. PERRL      Mouth/Throat: Mucous membranes are moist.       Neck: Supple with no signs  of meningismus. Cardiovascular: Regular rate and rhythm. No murmurs, gallops, or rubs. 2+ symmetrical distal pulses are present in all extremities. No JVD. Respiratory: Normal respiratory effort. Lungs are clear to auscultation bilaterally. No wheezes, crackles, or rhonchi.  Gastrointestinal: Soft, non tender, and non distended with positive bowel sounds. No rebound or guarding. Genitourinary: No CVA tenderness. Pelvic exam: Normal external genitalia, no rashes or lesions. Normal cervical mucus. Os closed. Small amount of blood in the vaginal vault.No cervical motion tenderness.  No uterine or adnexal tenderness.   Musculoskeletal:  Nontender with normal range of motion in all extremities. No edema, cyanosis, or erythema of extremities. Neurologic: Normal speech and language. Face is symmetric. Moving all extremities. No gross focal neurologic deficits are appreciated. Skin: Skin is warm, dry and intact. No rash noted. Psychiatric: Mood and affect are normal. Speech and behavior are normal.  ____________________________________________   LABS (all labs ordered are listed, but only abnormal results are displayed)  Labs Reviewed  HCG, QUANTITATIVE, PREGNANCY - Abnormal; Notable for the following:       Result Value   hCG, Beta Chain, Quant, S 13,431 (*)    All other components within normal limits  CBC   ____________________________________________  EKG  none ____________________________________________  RADIOLOGY  [place]:  Gestational sac is present within the uterine body with minimal increase in size from examination performed 6 days ago. No yolk sac or embryo. Endometrial heterogeneity and thickening. No subchorionic hemorrhage. While an early intrauterine pregnancy can have this appearance, findings are worrisome for impending non viability. Ectopic pregnancy is not excluded. Correlation with beta HCG and short-term repeat ultrasound may be helpful in further evaluation. ____________________________________________   PROCEDURES  Procedure(s) performed: None Procedures Critical Care performed:  None ____________________________________________   INITIAL IMPRESSION / ASSESSMENT AND PLAN / ED COURSE   25 y.o. female G2P1 currently at [redacted] weeks gestational age who presents for evaluation of abdominal cramping and vaginal spotting since last night. Exam showing closed os with small amount of blood in the vaginal vault. Abdominal exam with no abdominal tenderness. VS WNL. We'll repeat transvaginal ultrasound to rule out ectopic as the one done last week was too early in the pregnancy. We'll try and her hCG.  We'll check her hemoglobin. Patient received RhoGAM last week as she is Rh-. Not due for another dose at this time.  Clinical Course  Comment By Time  HCG hormone increased by 3000 which is less than would have been expected for a normal pregnancy and also transvaginal ultrasound showing gestational sac with no fetal pole. This is probably a nonviable pregnancy. Patient has a follow-up with her doctor on Monday for repeat ultrasound and trending of her hormones. I explained to the patient that we were unable to rule out 100% and ectopic pregnancy and therefore it is imperative that she follows up on her scheduled appointment. Also recommended her returning to the emergency room if she develops worsening abdominal pain. Patient remains hemodynamically stable with no tenderness to palpation on her abdomen. Nita Sickle, MD 09/27 1007    Pertinent labs & imaging results that were available during my care of the patient were reviewed by me and considered in my medical decision making (see chart for details).    ____________________________________________   FINAL CLINICAL IMPRESSION(S) / ED DIAGNOSES  Final diagnoses:  Vaginal bleeding in pregnancy, first trimester      NEW MEDICATIONS STARTED DURING THIS VISIT:  New Prescriptions   No medications on file  Note:  This document was prepared using Dragon voice recognition software and may include unintentional dictation errors.    Nita Sicklearolina Odetta Forness, MD 07/17/16 1011

## 2016-07-17 NOTE — Discharge Instructions (Signed)
As I explained to you your ultrasound at this time was unable to rule out an ectopic pregnancy. There is a chance that this pregnancy is not viable based on your pregnancy hormones and the results of the ultrasound. It is extremely important that you follow-up with your OB/GYN in 48 hours for repeat blood work and imaging. It is also important to return to the emergency room if you have new or worsening abdominal pain or severe vaginal bleeding.

## 2016-07-18 ENCOUNTER — Encounter: Payer: Self-pay | Admitting: *Deleted

## 2016-07-18 DIAGNOSIS — Z79899 Other long term (current) drug therapy: Secondary | ICD-10-CM | POA: Diagnosis not present

## 2016-07-18 DIAGNOSIS — Z3A08 8 weeks gestation of pregnancy: Secondary | ICD-10-CM | POA: Insufficient documentation

## 2016-07-18 DIAGNOSIS — I10 Essential (primary) hypertension: Secondary | ICD-10-CM | POA: Insufficient documentation

## 2016-07-18 DIAGNOSIS — O209 Hemorrhage in early pregnancy, unspecified: Secondary | ICD-10-CM | POA: Diagnosis present

## 2016-07-18 DIAGNOSIS — O200 Threatened abortion: Secondary | ICD-10-CM | POA: Diagnosis not present

## 2016-07-18 LAB — CBC WITH DIFFERENTIAL/PLATELET
BASOS ABS: 0 10*3/uL (ref 0–0.1)
BASOS PCT: 0 %
EOS ABS: 0.3 10*3/uL (ref 0–0.7)
Eosinophils Relative: 5 %
HEMATOCRIT: 34 % — AB (ref 35.0–47.0)
HEMOGLOBIN: 11.9 g/dL — AB (ref 12.0–16.0)
Lymphocytes Relative: 33 %
Lymphs Abs: 1.9 10*3/uL (ref 1.0–3.6)
MCH: 30 pg (ref 26.0–34.0)
MCHC: 34.9 g/dL (ref 32.0–36.0)
MCV: 85.9 fL (ref 80.0–100.0)
Monocytes Absolute: 0.7 10*3/uL (ref 0.2–0.9)
Monocytes Relative: 12 %
NEUTROS ABS: 2.9 10*3/uL (ref 1.4–6.5)
NEUTROS PCT: 50 %
Platelets: 191 10*3/uL (ref 150–440)
RBC: 3.96 MIL/uL (ref 3.80–5.20)
RDW: 13.3 % (ref 11.5–14.5)
WBC: 5.7 10*3/uL (ref 3.6–11.0)

## 2016-07-18 LAB — BASIC METABOLIC PANEL
ANION GAP: 3 — AB (ref 5–15)
BUN: 10 mg/dL (ref 6–20)
CHLORIDE: 104 mmol/L (ref 101–111)
CO2: 28 mmol/L (ref 22–32)
CREATININE: 0.83 mg/dL (ref 0.44–1.00)
Calcium: 9.4 mg/dL (ref 8.9–10.3)
GFR calc non Af Amer: >60 mL/min (ref >60)
Glucose, Bld: 115 mg/dL — ABNORMAL HIGH (ref 65–99)
Potassium: 3.8 mmol/L (ref 3.5–5.1)
SODIUM: 135 mmol/L (ref 135–145)

## 2016-07-18 LAB — HCG, QUANTITATIVE, PREGNANCY: HCG, BETA CHAIN, QUANT, S: 13070 m[IU]/mL — AB (ref <5)

## 2016-07-18 NOTE — ED Triage Notes (Signed)
Pt states she has increased vaginal bleeding for past 2 hours.  Pt is pregnant.  Pt was seen in er yesterday with similar sx.  Pt also reports abd cramping.

## 2016-07-19 ENCOUNTER — Emergency Department
Admission: EM | Admit: 2016-07-19 | Discharge: 2016-07-19 | Discharge status: Against Medical Advice | Payer: BLUE CROSS/BLUE SHIELD | Attending: Emergency Medicine | Admitting: Emergency Medicine

## 2016-07-19 DIAGNOSIS — O200 Threatened abortion: Secondary | ICD-10-CM

## 2016-07-19 MED ORDER — DIPHENHYDRAMINE HCL 50 MG/ML IJ SOLN: 50.0000 mg | Freq: Once | INTRAMUSCULAR | Status: DC

## 2016-07-19 MED ORDER — OXYCODONE-ACETAMINOPHEN 5-325 MG PO TABS
1.0000 | ORAL_TABLET | Freq: Once | ORAL | Status: AC
  Administered 2016-07-19: 1 via ORAL
  Filled 2016-07-19: qty 1

## 2016-07-19 NOTE — ED Notes (Signed)
MD at bedside. 

## 2016-07-19 NOTE — ED Notes (Addendum)
Pt increased vaginal bleeding x couple days. Pt is pregnant, Was seen here yesterday for same complaints. Pt c/o abd cramping

## 2016-07-19 NOTE — ED Provider Notes (Signed)
[facility] Emergency Department Provider Note   First MD Initiated Contact with Patient 07/19/16 [phone]     (approximate)  I have reviewed the triage vital signs and the nursing notes.   HISTORY  Chief Complaint Vaginal Bleeding   HPI Crystal Patel is a 25 y.o. female G2P1 presents with worsening pelvic cramping and vaginal bleeding. Patient was seen in the emergency department on 07/17/2016 similar complaint. Patient now returns or worsening symptoms. Patient states that her current pain score is 5 out of 10. Patient denies any fever   Past Medical History:  Diagnosis Date  . Hypertension     Patient Active Problem List   Diagnosis Date Noted  . Arm pain 02/14/2014    Past Surgical History:  Procedure Laterality Date  . BLADDER SURGERY      Prior to Admission medications   Medication Sig Start Date End Date Taking? Authorizing Provider  ondansetron (ZOFRAN ODT) 4 MG disintegrating tablet Take 1 tablet (4 mg total) by mouth every 8 (eight) hours as needed for nausea or vomiting. 06/29/16  Yes Minna AntisKevin Paduchowski, MD  etonogestrel (IMPLANON) 68 MG IMPL implant Inject 1 each into the skin once.    Historical Provider, MD    Allergies Review of patient's allergies indicates no known allergies.  No family history on file.  Social History Social History  Substance Use Topics  . Smoking status: Never Smoker  . Smokeless tobacco: Never Used  . Alcohol use No    Review of Systems Constitutional: No fever/chills Eyes: No visual changes. ENT: No sore throat. Cardiovascular: Denies chest pain. Respiratory: Denies shortness of breath. Gastrointestinal: No abdominal pain.  No nausea, no vomiting.  No diarrhea.  No constipation. Genitourinary: Positive for pelvic pain and vaginal bleeding Musculoskeletal: Negative for back pain. Skin: Negative for rash. Neurological: Negative for headaches, focal weakness or numbness.  10-point ROS otherwise  negative.  ____________________________________________   PHYSICAL EXAM:  VITAL SIGNS: ED Triage Vitals  Enc Vitals Group     BP 07/18/16 2240 (!) 148/74     Pulse Rate 07/18/16 2240 72     Resp 07/18/16 2240 18     Temp 07/18/16 2240 97.8 F (36.6 C)     Temp Source 07/18/16 2240 Oral     SpO2 07/18/16 2240 98 %     Weight 07/18/16 2241 214 lb (97.1 kg)     Height 07/18/16 2241 5\' 7"  (1.702 m)     Head Circumference --      Peak Flow --      Pain Score 07/18/16 2242 6     Pain Loc --      Pain Edu? --      Excl. in GC? --     Constitutional: Alert and oriented. Well appearing and in no acute distress. Eyes: Conjunctivae are normal. PERRL. EOMI. Head: Atraumatic. Mouth/Throat: Mucous membranes are moist.  Oropharynx non-erythematous. Neck: No stridor.  No meningeal signs.   Cardiovascular: Normal rate, regular rhythm. Good peripheral circulation. Grossly normal heart sounds. Respiratory: Normal respiratory effort.  No retractions. Lungs CTAB. Gastrointestinal: Soft and nontender. No distention.  Genitourinary: Patient eloped before this could be performed Musculoskeletal: No lower extremity tenderness nor edema. No gross deformities of extremities. Neurologic:  Normal speech and language. No gross focal neurologic deficits are appreciated.  Skin:  Skin is warm, dry and intact. No rash noted. Psychiatric: Mood and affect are normal. Speech and behavior are normal.  ____________________________________________   LABS (all  labs ordered are listed, but only abnormal results are displayed)  Labs Reviewed  HCG, QUANTITATIVE, PREGNANCY - Abnormal; Notable for the following:       Result Value   hCG, Beta Chain, Quant, S 13,070 (*)    All other components within normal limits  CBC WITH DIFFERENTIAL/PLATELET - Abnormal; Notable for the following:    Hemoglobin 11.9 (*)    HCT 34.0 (*)    All other components within normal limits  BASIC METABOLIC PANEL - Abnormal; Notable  for the following:    Glucose, Bld 115 (*)    Anion gap 3 (*)    All other components within normal limits    Procedures     INITIAL IMPRESSION / ASSESSMENT AND PLAN / ED COURSE  Pertinent labs & imaging results that were available during my care of the patient were reviewed by me and considered in my medical decision making (see chart for details).  Concern for possible miscarriage given that the patient's hCG Quant has decreased some 13,431 to 13,070 within 24 hours. Patient discussed with Dr. Jean Rosenthal OB/GYN on call for [facility] who agreed with plan to prescribe by mouth analgesia and have the patient follow-up in clinic today as planned. However, return to the room the patient had artery eloped from the emergency department.   Clinical Course    ____________________________________________  FINAL CLINICAL IMPRESSION(S) / ED DIAGNOSES  Final diagnoses:  Threatened miscarriage     MEDICATIONS GIVEN DURING THIS VISIT:  Medications  oxyCODONE-acetaminophen (PERCOCET/ROXICET) 5-325 MG per tablet 1 tablet (1 tablet Oral Given 07/19/16 0257)     NEW OUTPATIENT MEDICATIONS STARTED DURING THIS VISIT:  New Prescriptions   No medications on file    Modified Medications   No medications on file    Discontinued Medications   No medications on file     Note:  This document was prepared using Dragon voice recognition software and may include unintentional dictation errors.    Darci Current, MD 07/19/16 [phone]

## 2016-07-19 NOTE — ED Notes (Signed)
Patient eloped prior to pelvic exam. 

## 2016-12-18 ENCOUNTER — Emergency Department: Payer: BLUE CROSS/BLUE SHIELD

## 2016-12-18 ENCOUNTER — Emergency Department
Admission: EM | Admit: 2016-12-18 | Discharge: 2016-12-18 | Disposition: A | Discharge status: Home / Self Care | Payer: BLUE CROSS/BLUE SHIELD | Attending: Emergency Medicine | Admitting: Emergency Medicine

## 2016-12-18 DIAGNOSIS — G43901 Migraine, unspecified, not intractable, with status migrainosus: Secondary | ICD-10-CM | POA: Insufficient documentation

## 2016-12-18 DIAGNOSIS — I10 Essential (primary) hypertension: Secondary | ICD-10-CM | POA: Insufficient documentation

## 2016-12-18 DIAGNOSIS — Z79899 Other long term (current) drug therapy: Secondary | ICD-10-CM | POA: Insufficient documentation

## 2016-12-18 MED ORDER — KETOROLAC TROMETHAMINE 60 MG/2ML IM SOLN
15.0000 mg | Freq: Once | INTRAMUSCULAR | Status: AC
  Administered 2016-12-18: 15 mg via INTRAMUSCULAR
  Filled 2016-12-18: qty 2

## 2016-12-18 MED ORDER — DIPHENHYDRAMINE HCL 25 MG PO CAPS: 50.0000 mg | ORAL_CAPSULE | Freq: Four times a day (QID) | ORAL | 0 refills | Status: AC | PRN

## 2016-12-18 MED ORDER — METOCLOPRAMIDE HCL 10 MG PO TABS
10.0000 mg | ORAL_TABLET | Freq: Once | ORAL | Status: AC
  Administered 2016-12-18: 10 mg via ORAL
  Filled 2016-12-18: qty 1

## 2016-12-18 MED ORDER — METOCLOPRAMIDE HCL 10 MG PO TABS: 10.0000 mg | ORAL_TABLET | Freq: Four times a day (QID) | ORAL | 0 refills | Status: AC | PRN

## 2016-12-18 MED ORDER — NAPROXEN 500 MG PO TABS: 500.0000 mg | ORAL_TABLET | Freq: Two times a day (BID) | ORAL | 0 refills | Status: DC

## 2016-12-18 MED ORDER — DIPHENHYDRAMINE HCL 25 MG PO CAPS
25.0000 mg | ORAL_CAPSULE | ORAL | Status: AC
  Administered 2016-12-18: 25 mg via ORAL
  Filled 2016-12-18: qty 1

## 2016-12-18 NOTE — ED Triage Notes (Signed)
Pt states HA that began yesterday morning that is "all over". States light, movement, and sound sensitivity, denies blurred vision. No hx of migraines. Alert, oriented, ambulatory. States yest vomiting twice.

## 2016-12-18 NOTE — ED Notes (Signed)
Pt received verbal discharge from Dr. Scotty CourtStafford. Pt left room without waiting for discharge paperwork including prescriptions. Left message on home phone listed that discharge paperwork would be available at stat desk.

## 2016-12-18 NOTE — Discharge Instructions (Signed)
You were seen in the ED for a new severe headache. Your CT scan today was normal. The symptoms are suggestive of a migraine headache, but since you have never had significant headaches in the past, please follow up closely with your primary care doctor to continue monitoring your symptoms. Return to the emergency room if you develop fever, neck stiffness, vision changes, or numbness, tingling, or weakness anywhere in your body. These can be signs of infection, impaired blood flow to parts of your brain such as a stroke, or increased pressure in the brain.

## 2016-12-18 NOTE — ED Provider Notes (Signed)
[facility] Emergency Department Provider Note  ____________________________________________  Time seen: Approximately 5:58 PM  I have reviewed the triage vital signs and the nursing notes.   HISTORY  Chief Complaint Headache    HPI Crystal Patel is a 26 y.o. female who complains of bilateral frontal headache for the past 2 days, associated with photophobia and phonophobia. No vision changes. No numbness tingling or weakness. No syncope. Had some vomiting yesterday. No change in balance or coordination. No history of migraines or other significant headache pattern. No recent trauma. No fever, neck stiffness, or changes in concentration or alertness.     Past Medical History:  Diagnosis Date  . Hypertension      Patient Active Problem List   Diagnosis Date Noted  . Arm pain 02/14/2014     Past Surgical History:  Procedure Laterality Date  . BLADDER SURGERY       Prior to Admission medications   Medication Sig Start Date End Date Taking? Authorizing Provider  diphenhydrAMINE (BENADRYL) 25 mg capsule Take 2 capsules (50 mg total) by mouth every 6 (six) hours as needed. 12/18/16   Sharman Cheek, MD  etonogestrel (IMPLANON) 68 MG IMPL implant Inject 1 each into the skin once.    Historical Provider, MD  metoCLOPramide (REGLAN) 10 MG tablet Take 1 tablet (10 mg total) by mouth every 6 (six) hours as needed. 12/18/16   Sharman Cheek, MD  naproxen (NAPROSYN) 500 MG tablet Take 1 tablet (500 mg total) by mouth 2 (two) times daily with a meal. 12/18/16   Sharman Cheek, MD  ondansetron (ZOFRAN ODT) 4 MG disintegrating tablet Take 1 tablet (4 mg total) by mouth every 8 (eight) hours as needed for nausea or vomiting. 06/29/16   [name], MD     Allergies Patient has no known allergies.   History reviewed. No pertinent family history.  Social History Social History  Substance Use Topics  . Smoking status: Never Smoker  . Smokeless  tobacco: Never Used  . Alcohol use No    Review of Systems  Constitutional:   No fever or chills.  ENT:   No sore throat. No rhinorrhea. Cardiovascular:   No chest pain. Respiratory:   No dyspnea or cough. Gastrointestinal:   Negative for abdominal pain, vomiting and diarrhea.  Genitourinary:   Negative for dysuria or difficulty urinating. Musculoskeletal:   Negative for focal pain or swelling Neurological:   Positive as above for headache 10-point ROS otherwise negative.  ____________________________________________   PHYSICAL EXAM:  VITAL SIGNS: ED Triage Vitals  Enc Vitals Group     BP 12/18/16 1427 138/75     Pulse Rate 12/18/16 1427 96     Resp 12/18/16 1427 18     Temp 12/18/16 1427 98.3 F (36.8 C)     Temp Source 12/18/16 1427 Oral     SpO2 12/18/16 1427 100 %     Weight 12/18/16 1428 212 lb (96.2 kg)     Height 12/18/16 1428 5\' 8"  (1.727 m)     Head Circumference --      Peak Flow --      Pain Score 12/18/16 1428 7     Pain Loc --      Pain Edu? --      Excl. in GC? --     Vital signs reviewed, nursing assessments reviewed.   Constitutional:   Alert and oriented. Well appearing and in no distress. Eyes:   No scleral icterus. No conjunctival pallor.  PERRL. EOMI.  No nystagmus. ENT   Head:   Normocephalic and atraumatic.   Nose:   No congestion/rhinnorhea. No septal hematoma   Mouth/Throat:   MMM, no pharyngeal erythema. No peritonsillar mass.    Neck:   No stridor. No SubQ emphysema. No meningismus.Painless range of motion Hematological/Lymphatic/Immunilogical:   No cervical lymphadenopathy. Cardiovascular:   RRR. Symmetric bilateral radial and DP pulses.  No murmurs.  Respiratory:   Normal respiratory effort without tachypnea nor retractions. Breath sounds are clear and equal bilaterally. No wheezes/rales/rhonchi. Gastrointestinal:   Soft and nontender. Non distended. There is no CVA tenderness.  No rebound, rigidity, or  guarding. Musculoskeletal:   Normal range of motion in all extremities. No joint effusions.  No lower extremity tenderness.  No edema. Neurologic:   Normal speech and language.  CN 2-10 normal. Normal finger-nose-finger, normal gait. Motor grossly intact. No gross focal neurologic deficits are appreciated.   ____________________________________________    LABS (pertinent positives/negatives) (all labs ordered are listed, but only abnormal results are displayed) Labs Reviewed - No data to display ____________________________________________   EKG    ____________________________________________    RADIOLOGY  Ct Head Wo Contrast  Result Date: 12/18/2016 CLINICAL DATA:  Headache with photophobia EXAM: CT HEAD WITHOUT CONTRAST TECHNIQUE: Contiguous axial images were obtained from the base of the skull through the vertex without intravenous contrast. COMPARISON:  None. FINDINGS: Brain: The ventricles are normal in size and configuration. Left lateral ventricle is marginally larger than right lateral ventricle, an apparent anatomic variant. There is no intracranial mass, hemorrhage, extra-axial fluid collection, or midline shift. Gray-white compartments are normal. No acute infarct evident. Vascular: There is no hyperdense vessel. There is no appreciable vascular calcification. Skull: The bony calvarium appears intact. Sinuses/Orbits: There is a small focus of opacification in an anteromedial left ethmoid air cell. Slight mucosal thickening in the anterior ethmoid air cells bilaterally noted. Other paranasal sinuses which are visualized appear normal. Orbits appear symmetric bilaterally. Other: Mastoid air cells are clear. IMPRESSION: Slight ethmoid sinus disease. No intracranial mass, hemorrhage, or extra-axial fluid collection. Gray-white compartments appear normal. Electronically Signed   By: Bretta BangWilliam  Woodruff III M.D.   On: 12/18/2016 15:53     ____________________________________________   PROCEDURES Procedures  ____________________________________________   INITIAL IMPRESSION / ASSESSMENT AND PLAN / ED COURSE  Pertinent labs & imaging results that were available during my care of the patient were reviewed by me and considered in my medical decision making (see chart for details).  Patient well appearing no acute distress, presents with migraine type headache, but severe and continuous for greater than 24 hours, without a history of migraines or other significant headache pattern. No vision changes, worse standing instead of supine, so low suspicion of intracranial hypertension. No evidence of infection stroke or hemorrhage. CT negative. Patient feeling better after Benadryl and Reglan. We'll discharge home with continued symptomatic management, advised to follow up closely with primary care for continued monitoring of symptoms since this is new, usual return precautions given including for any signs of stroke infection or increased pressure.  Considering the patient's symptoms, medical history, and physical examination today, I have low suspicion for ischemic stroke, intracranial hemorrhage, meningitis, encephalitis, carotid or vertebral dissection, venous sinus thrombosis, MS, intracranial hypertension, glaucoma, CRAO, CRVO, or temporal arteritis.          ____________________________________________   FINAL CLINICAL IMPRESSION(S) / ED DIAGNOSES  Final diagnoses:  Migraine with status migrainosus, not intractable, unspecified migraine type  New Prescriptions   DIPHENHYDRAMINE (BENADRYL) 25 MG CAPSULE    Take 2 capsules (50 mg total) by mouth every 6 (six) hours as needed.   METOCLOPRAMIDE (REGLAN) 10 MG TABLET    Take 1 tablet (10 mg total) by mouth every 6 (six) hours as needed.   NAPROXEN (NAPROSYN) 500 MG TABLET    Take 1 tablet (500 mg total) by mouth 2 (two) times daily with a meal.     Portions  of this note were generated with dragon dictation software. Dictation errors may occur despite best attempts at proofreading.    Sharman Cheek, MD 12/18/16 [phone]

## 2016-12-18 NOTE — ED Notes (Signed)
Patient transported to CT 

## 2017-05-25 ENCOUNTER — Emergency Department
Admission: EM | Admit: 2017-05-25 | Discharge: 2017-05-25 | Disposition: A | Discharge status: Home / Self Care | Payer: BLUE CROSS/BLUE SHIELD | Attending: Emergency Medicine | Admitting: Emergency Medicine

## 2017-05-25 DIAGNOSIS — Z791 Long term (current) use of non-steroidal anti-inflammatories (NSAID): Secondary | ICD-10-CM | POA: Insufficient documentation

## 2017-05-25 DIAGNOSIS — K047 Periapical abscess without sinus: Secondary | ICD-10-CM | POA: Insufficient documentation

## 2017-05-25 DIAGNOSIS — Z79899 Other long term (current) drug therapy: Secondary | ICD-10-CM | POA: Insufficient documentation

## 2017-05-25 DIAGNOSIS — I10 Essential (primary) hypertension: Secondary | ICD-10-CM | POA: Insufficient documentation

## 2017-05-25 HISTORY — DX: Disorder of kidney and ureter, unspecified: N28.9

## 2017-05-25 MED ORDER — AMOXICILLIN 500 MG PO CAPS
500.0000 mg | ORAL_CAPSULE | Freq: Once | ORAL | Status: AC
  Administered 2017-05-25: 500 mg via ORAL
  Filled 2017-05-25: qty 1

## 2017-05-25 MED ORDER — IBUPROFEN 600 MG PO TABS: 600.0000 mg | ORAL_TABLET | Freq: Three times a day (TID) | ORAL | 0 refills | Status: DC | PRN

## 2017-05-25 MED ORDER — TRAMADOL HCL 50 MG PO TABS: 50.0000 mg | ORAL_TABLET | Freq: Four times a day (QID) | ORAL | 0 refills | Status: DC | PRN

## 2017-05-25 MED ORDER — IBUPROFEN 800 MG PO TABS
800.0000 mg | ORAL_TABLET | Freq: Once | ORAL | Status: AC
  Administered 2017-05-25: 800 mg via ORAL
  Filled 2017-05-25: qty 1

## 2017-05-25 MED ORDER — TRAMADOL HCL 50 MG PO TABS
50.0000 mg | ORAL_TABLET | Freq: Once | ORAL | Status: DC
  Filled 2017-05-25: qty 1

## 2017-05-25 MED ORDER — AMOXICILLIN 500 MG PO CAPS: 500.0000 mg | ORAL_CAPSULE | Freq: Three times a day (TID) | ORAL | 0 refills | Status: DC

## 2017-05-25 NOTE — ED Triage Notes (Signed)
Patient c/o right lower dental pain/swelling.

## 2017-05-25 NOTE — ED Provider Notes (Signed)
[facility] Emergency Department Provider Note   ____________________________________________   First MD Initiated Contact with Patient 05/25/17 2153     (approximate)  I have reviewed the triage vital signs and the nursing notes.   HISTORY  Chief Complaint Dental Pain (right lower)    HPI Crystal Patel is a 26 y.o. female patient complaining of dental pain secondary to loosen the Upper root canal. Patient lost Approximately 2 months ago. Patient state gum is swollen and pain has increased.She rates the pain as a 6/10. Patient described a pain as "throbbing". No palliative measures for complaint.   Past Medical History:  Diagnosis Date  . Hypertension   . Renal disorder     Patient Active Problem List   Diagnosis Date Noted  . Arm pain 02/14/2014    Past Surgical History:  Procedure Laterality Date  . BLADDER SURGERY      Prior to Admission medications   Medication Sig Start Date End Date Taking? Authorizing Provider  amoxicillin (AMOXIL) 500 MG capsule Take 1 capsule (500 mg total) by mouth 3 (three) times daily. 05/25/17   Joni ReiningSmith, Ronald K, PA-C  diphenhydrAMINE (BENADRYL) 25 mg capsule Take 2 capsules (50 mg total) by mouth every 6 (six) hours as needed. 12/18/16   Sharman CheekStafford, Phillip, MD  etonogestrel (IMPLANON) 68 MG IMPL implant Inject 1 each into the skin once.    [provider]  ibuprofen (ADVIL,MOTRIN) 600 MG tablet Take 1 tablet (600 mg total) by mouth every 8 (eight) hours as needed. 05/25/17   Joni ReiningSmith, Ronald K, PA-C  metoCLOPramide (REGLAN) 10 MG tablet Take 1 tablet (10 mg total) by mouth every 6 (six) hours as needed. 12/18/16   Sharman CheekStafford, Phillip, MD  naproxen (NAPROSYN) 500 MG tablet Take 1 tablet (500 mg total) by mouth 2 (two) times daily with a meal. 12/18/16   Sharman CheekStafford, Phillip, MD  ondansetron (ZOFRAN ODT) 4 MG disintegrating tablet Take 1 tablet (4 mg total) by mouth every 8 (eight) hours as needed for nausea or vomiting.  06/29/16   Minna AntisPaduchowski, Kevin, MD  traMADol (ULTRAM) 50 MG tablet Take 1 tablet (50 mg total) by mouth every 6 (six) hours as needed. 05/25/17 05/25/18  Joni ReiningSmith, Ronald K, PA-C    Allergies Patient has no known allergies.  History reviewed. No pertinent family history.  Social History Social History  Substance Use Topics  . Smoking status: Never Smoker  . Smokeless tobacco: Never Used  . Alcohol use No    Review of Systems Constitutional: No fever/chills Eyes: No visual changes. ENT: No sore throat. Toothache Cardiovascular: Denies chest pain. Respiratory: Denies shortness of breath. Gastrointestinal: No abdominal pain.  No nausea, no vomiting.  No diarrhea.  No constipation. Genitourinary: Negative for dysuria. Musculoskeletal: Negative for back pain. Skin: Negative for rash. Neurological: Negative for headaches, focal weakness or numbness.   ____________________________________________   PHYSICAL EXAM:  VITAL SIGNS: ED Triage Vitals  Enc Vitals Group     BP 05/25/17 2130 132/65     Pulse Rate 05/25/17 2130 97     Resp 05/25/17 2130 15     Temp 05/25/17 2130 98.4 F (36.9 C)     Temp Source 05/25/17 2130 Oral     SpO2 05/25/17 2130 99 %     Weight 05/25/17 2130 204 lb (92.5 kg)     Height 05/25/17 2130 5\' 7"  (1.702 m)     Head Circumference --      Peak Flow --  Pain Score 05/25/17 2129 6     Pain Loc --      Pain Edu? --      Excl. in GC? --     Constitutional: Alert and oriented. Well appearing and in no acute distress. Mouth/Throat: Mucous membranes are moist.  Oropharynx non-erythematous. Devitalize tooth #29 with moderate gingival edema. Hematological/Lymphatic/Immunilogical: No cervical lymphadenopathy. Cardiovascular: Normal rate, regular rhythm. Grossly normal heart sounds.  Good peripheral circulation. Respiratory: Normal respiratory effort.  No retractions. Lungs CTAB. Neurologic:  Normal speech and language. No gross focal neurologic deficits are  appreciated. No gait instability. Skin:  Skin is warm, dry and intact. No rash noted. Psychiatric: Mood and affect are normal. Speech and behavior are normal.  ____________________________________________   LABS (all labs ordered are listed, but only abnormal results are displayed)  Labs Reviewed - No data to display ____________________________________________  EKG   ____________________________________________  RADIOLOGY  No results found.  ____________________________________________   PROCEDURES  Procedure(s) performed: None  Procedures  Critical Care performed: No  ____________________________________________   INITIAL IMPRESSION / ASSESSMENT AND PLAN / ED COURSE  Pertinent labs & imaging results that were available during my care of the patient were reviewed by me and considered in my medical decision making (see chart for details).  Dental abscess. Patient given discharge care instructions and advised to follow with the walk-in dental clinic in the morning.      ____________________________________________   FINAL CLINICAL IMPRESSION(S) / ED DIAGNOSES  Final diagnoses:  Dental abscess      NEW MEDICATIONS STARTED DURING THIS VISIT:  New Prescriptions   AMOXICILLIN (AMOXIL) 500 MG CAPSULE    Take 1 capsule (500 mg total) by mouth 3 (three) times daily.   IBUPROFEN (ADVIL,MOTRIN) 600 MG TABLET    Take 1 tablet (600 mg total) by mouth every 8 (eight) hours as needed.   TRAMADOL (ULTRAM) 50 MG TABLET    Take 1 tablet (50 mg total) by mouth every 6 (six) hours as needed.     Note:  This document was prepared using Dragon voice recognition software and may include unintentional dictation errors.    Joni ReiningSmith, Ronald K, PA-C 05/25/17 2202    Pershing ProudSchaevitz, Myra Rudeavid Matthew, MD 05/25/17 2223

## 2017-08-03 ENCOUNTER — Encounter: Payer: Self-pay | Admitting: Emergency Medicine

## 2017-08-03 ENCOUNTER — Emergency Department
Admission: EM | Admit: 2017-08-03 | Discharge: 2017-08-03 | Disposition: A | Discharge status: Home / Self Care | Payer: BLUE CROSS/BLUE SHIELD | Attending: Emergency Medicine | Admitting: Emergency Medicine

## 2017-08-03 DIAGNOSIS — R42 Dizziness and giddiness: Secondary | ICD-10-CM

## 2017-08-03 DIAGNOSIS — Z79899 Other long term (current) drug therapy: Secondary | ICD-10-CM | POA: Insufficient documentation

## 2017-08-03 DIAGNOSIS — R110 Nausea: Secondary | ICD-10-CM | POA: Insufficient documentation

## 2017-08-03 DIAGNOSIS — H6982 Other specified disorders of Eustachian tube, left ear: Secondary | ICD-10-CM

## 2017-08-03 DIAGNOSIS — I10 Essential (primary) hypertension: Secondary | ICD-10-CM | POA: Insufficient documentation

## 2017-08-03 MED ORDER — CETIRIZINE HCL 10 MG PO TABS: 10.0000 mg | ORAL_TABLET | Freq: Every day | ORAL | 0 refills | Status: AC

## 2017-08-03 MED ORDER — MECLIZINE HCL 32 MG PO TABS: 32.0000 mg | ORAL_TABLET | Freq: Three times a day (TID) | ORAL | 0 refills | Status: AC | PRN

## 2017-08-03 MED ORDER — FLUTICASONE PROPIONATE 50 MCG/ACT NA SUSP: 1.0000 | Freq: Two times a day (BID) | NASAL | 0 refills | Status: AC

## 2017-08-03 MED ORDER — MECLIZINE HCL 25 MG PO TABS
25.0000 mg | ORAL_TABLET | Freq: Once | ORAL | Status: AC
  Administered 2017-08-03: 25 mg via ORAL
  Filled 2017-08-03: qty 1

## 2017-08-03 NOTE — ED Triage Notes (Signed)
Cough, cold, congestion and left ear congestion and pain x 3 days. Treating with cough medicine and tylenol severe cold

## 2017-08-03 NOTE — ED Provider Notes (Signed)
[facility] Emergency Department Provider Note  ____________________________________________  Time seen: Approximately 10:27 PM  I have reviewed the triage vital signs and the nursing notes.   HISTORY  Chief Complaint Otalgia (left) and Nausea    HPI Crystal Patel is a 26 y.o. female Who presents to emergency department complaining of left ear pain and dizziness and nausea. Patient reports that she had a recent upper respiratory infection has resolved. Patient reports that she had some ear fullness that has increased over the past several days. Patient reports that it is a pressure that is leading towards pain. Today she had some vertigo like symptoms with dizziness, feeling like the room was spinning, with some mild nausea and emesis. Patient is not taking medications for this complaint. No history of same. No other complaints at this time.   Past Medical History:  Diagnosis Date  . Hypertension   . Renal disorder     Patient Active Problem List   Diagnosis Date Noted  . Arm pain 02/14/2014    Past Surgical History:  Procedure Laterality Date  . BLADDER SURGERY      Prior to Admission medications   Medication Sig Start Date End Date Taking? Authorizing Provider  amoxicillin (AMOXIL) 500 MG capsule Take 1 capsule (500 mg total) by mouth 3 (three) times daily. 05/25/17   Joni Reining, PA-C  cetirizine (ZYRTEC) 10 MG tablet Take 1 tablet (10 mg total) by mouth daily. 08/03/17   Marenda Accardi, Delorise Royals, PA-C  diphenhydrAMINE (BENADRYL) 25 mg capsule Take 2 capsules (50 mg total) by mouth every 6 (six) hours as needed. 12/18/16   Sharman Cheek, MD  etonogestrel (IMPLANON) 68 MG IMPL implant Inject 1 each into the skin once.    [provider]  fluticasone (FLONASE) 50 MCG/ACT nasal spray Place 1 spray into both nostrils 2 (two) times daily. 08/03/17   Lovell Nuttall, Delorise Royals, PA-C  ibuprofen (ADVIL,MOTRIN) 600 MG tablet Take 1 tablet (600 mg  total) by mouth every 8 (eight) hours as needed. 05/25/17   Joni Reining, PA-C  meclizine (ANTIVERT) 32 MG tablet Take 1 tablet (32 mg total) by mouth 3 (three) times daily as needed. 08/03/17   Glendel Jaggers, Delorise Royals, PA-C  metoCLOPramide (REGLAN) 10 MG tablet Take 1 tablet (10 mg total) by mouth every 6 (six) hours as needed. 12/18/16   Sharman Cheek, MD  naproxen (NAPROSYN) 500 MG tablet Take 1 tablet (500 mg total) by mouth 2 (two) times daily with a meal. 12/18/16   Sharman Cheek, MD  ondansetron (ZOFRAN ODT) 4 MG disintegrating tablet Take 1 tablet (4 mg total) by mouth every 8 (eight) hours as needed for nausea or vomiting. 06/29/16   [name], MD  traMADol (ULTRAM) 50 MG tablet Take 1 tablet (50 mg total) by mouth every 6 (six) hours as needed. 05/25/17 05/25/18  Joni Reining, PA-C    Allergies Patient has no known allergies.  History reviewed. No pertinent family history.  Social History Social History  Substance Use Topics  . Smoking status: Never Smoker  . Smokeless tobacco: Never Used  . Alcohol use No     Review of Systems  Constitutional: No fever/chills Eyes: No visual changes. No discharge ENT: positive for left ear pressure. Cardiovascular: no chest pain. Respiratory: no cough. No SOB. Gastrointestinal: No abdominal pain.  Positive for nausea and one episode of emesis..  No diarrhea.  No constipation. Musculoskeletal: Negative for musculoskeletal pain. Skin: Negative for rash, abrasions, lacerations, ecchymosis.  Neurological: Negative for headaches, focal weakness or numbness.positive for dizziness and vertigo like symptoms. 10-point ROS otherwise negative.  ____________________________________________   PHYSICAL EXAM:  VITAL SIGNS: ED Triage Vitals [08/03/17 2156]  Enc Vitals Group     BP 131/74     Pulse Rate 76     Resp 18     Temp 98.6 F (37 C)     Temp Source Oral     SpO2 100 %     Weight 198 lb (89.8 kg)     Height  (1.702  m)     Head Circumference      Peak Flow      Pain Score      Pain Loc      Pain Edu?      Excl. in GC?      Constitutional: Alert and oriented. Well appearing and in no acute distress. Eyes: Conjunctivae are normal. PERRL. EOMI. Head: Atraumatic. ENT:      Ears: EDC is unremarkable bilaterally. TM on left is appropriate color, bulging, no air fluid level.      Nose: No congestion/rhinnorhea.      Mouth/Throat: Mucous membranes are moist.  Neck: No stridor.   Cardiovascular: Normal rate, regular rhythm. Normal S1 and S2.  Good peripheral circulation. Respiratory: Normal respiratory effort without tachypnea or retractions. Lungs CTAB. Good air entry to the bases with no decreased or absent breath sounds. Musculoskeletal: Full range of motion to all extremities. No gross deformities appreciated. Neurologic:  Normal speech and language. No gross focal neurologic deficits are appreciated.  Skin:  Skin is warm, dry and intact. No rash noted. Psychiatric: Mood and affect are normal. Speech and behavior are normal. Patient exhibits appropriate insight and judgement.   ____________________________________________   LABS (all labs ordered are listed, but only abnormal results are displayed)  Labs Reviewed - No data to display ____________________________________________  EKG   ____________________________________________  RADIOLOGY   No results found.  ____________________________________________    PROCEDURES  Procedure(s) performed:    Procedures    Medications  meclizine (ANTIVERT) tablet 25 mg (not administered)     ____________________________________________   INITIAL IMPRESSION / ASSESSMENT AND PLAN / ED COURSE  Pertinent labs & imaging results that were available during my care of the patient were reviewed by me and considered in my medical decision making (see chart for details).  Review of the [place] CSRS was performed in accordance of the [facility] prior  to dispensing any controlled drugs.     Patient's diagnosis is consistent with eustachian tube dysfunction from previous URI and vertigo. Differential included sinusitis, otitis media, otitis externa, vertigo, gastroenteritis. After exam, patient's exam and symptoms are consistent withstation tube dysfunction on left with vertigo fromeffects of the eustachian tube dysfunction. He was given meclizine in the emergency department and will be discharged home with prescriptions for meclizine, Flonase, Zyrtec. Patient will follow-up with primary care as needed. Patient is given ED precautions to return to the ED for any worsening or new symptoms.     ____________________________________________  FINAL CLINICAL IMPRESSION(S) / ED DIAGNOSES  Final diagnoses:  Eustachian tube dysfunction, left  Vertigo      NEW MEDICATIONS STARTED DURING THIS VISIT:  New Prescriptions   CETIRIZINE (ZYRTEC) 10 MG TABLET    Take 1 tablet (10 mg total) by mouth daily.   FLUTICASONE (FLONASE) 50 MCG/ACT NASAL SPRAY    Place 1 spray into both nostrils 2 (two) times daily.   MECLIZINE (ANTIVERT) 32 MG  TABLET    Take 1 tablet (32 mg total) by mouth 3 (three) times daily as needed.        This chart was dictated using voice recognition software/Dragon. Despite best efforts to proofread, errors can occur which can change the meaning. Any change was purely unintentional.    Racheal Patches, PA-C 08/03/17 2235    Emily Filbert, MD 08/03/17 2238

## 2017-10-21 NOTE — L&D Delivery Note (Addendum)
Delivery Note  Date of delivery: 05/18/2018 Estimated Date of Delivery: 05/11/18 Patient's last menstrual period was 08/04/2017 (exact date). EGA: 6141w0d  First Stage: Labor onset: 0100 05/18/18 Induction : misoprostol x1 Analgesia Eliezer Lofts/Anesthesia intrapartum: IV Stadol x1, epidural test dose only SROM at 0230 clear fluid  Crystal Patel- presented to L&D for induction of labor. She was induced one dose of misoprostol. One dose of Stadol given and epidural was placed just before delivery with test dose only. Test dose did not provide any relief prior to delivery.    Second Stage: Complete dilation at 0520 Onset of pushing at 0524 FHR second stage: category II Delivery at 0527 on 05/18/2018  She progressed to complete and had a spontaneous vaginal birth of a live female over an intact perineum. The fetal head was delivered in direct OA position with restitution to ROA. No nuchal cord. Anterior then posterior shoulders delivered with minimal assistance. Baby placed on mom's abdomen and attended to by transition RN. Cord clamped and cut after 2 minute delay by myself. Cord blood obtained for newborn labs.  Third Stage: Placenta delivered spontaneously intact with 3VC at 0532 Placenta disposition: routine disposal Uterine tone firm / bleeding brisk initially, slowed with fundal massage IV pitocin given for hemorrhage prophylyaxis  2nd degree perineal laceration identified  Anesthesia for repair: lidocaine Repair: 3-0 Vicryl  QBL (mL): 574  Complications: precipitous labor  Mom to postpartum.  Baby to Couplet care / Skin to Skin.  Newborn: Birth Weight: 6 lb 15.8 oz (3170 g)  Apgar Scores: 8, 9 Feeding planned: formula  Crystal Patel, CNM 05/18/2018 6:20 AM

## 2017-10-29 LAB — OB RESULTS CONSOLE GC/CHLAMYDIA: Gonorrhea: NEGATIVE

## 2017-10-29 LAB — OB RESULTS CONSOLE HIV ANTIBODY (ROUTINE TESTING): HIV: NONREACTIVE

## 2017-10-29 LAB — OB RESULTS CONSOLE VARICELLA ZOSTER ANTIBODY, IGG: Varicella: NON-IMMUNE/NOT IMMUNE

## 2017-10-29 LAB — OB RESULTS CONSOLE RPR: RPR: NONREACTIVE

## 2017-10-29 LAB — OB RESULTS CONSOLE RUBELLA ANTIBODY, IGM: Rubella: IMMUNE

## 2017-10-29 LAB — OB RESULTS CONSOLE HEPATITIS B SURFACE ANTIGEN: Hepatitis B Surface Ag: NEGATIVE

## 2017-10-30 LAB — OB RESULTS CONSOLE GC/CHLAMYDIA: Chlamydia: NEGATIVE

## 2017-11-27 ENCOUNTER — Other Ambulatory Visit: Payer: Self-pay | Admitting: Family Medicine

## 2017-11-27 DIAGNOSIS — Z3480 Encounter for supervision of other normal pregnancy, unspecified trimester: Secondary | ICD-10-CM

## 2017-12-22 ENCOUNTER — Ambulatory Visit
Admission: RE | Admit: 2017-12-22 | Discharge: 2017-12-22 | Disposition: A | Discharge status: Home / Self Care | Payer: Medicaid Other | Source: Ambulatory Visit | Attending: Family Medicine | Admitting: Family Medicine

## 2017-12-22 DIAGNOSIS — Z3482 Encounter for supervision of other normal pregnancy, second trimester: Secondary | ICD-10-CM | POA: Insufficient documentation

## 2017-12-22 DIAGNOSIS — Z3A18 18 weeks gestation of pregnancy: Secondary | ICD-10-CM | POA: Diagnosis not present

## 2017-12-22 DIAGNOSIS — Z3480 Encounter for supervision of other normal pregnancy, unspecified trimester: Secondary | ICD-10-CM

## 2017-12-24 ENCOUNTER — Ambulatory Visit: Payer: BLUE CROSS/BLUE SHIELD

## 2018-01-08 ENCOUNTER — Ambulatory Visit: Payer: BLUE CROSS/BLUE SHIELD

## 2018-03-30 ENCOUNTER — Encounter: Payer: Self-pay | Admitting: *Deleted

## 2018-03-30 ENCOUNTER — Observation Stay
Admission: EM | Admit: 2018-03-30 | Discharge: 2018-03-31 | Disposition: A | Discharge status: Home / Self Care | Payer: Medicaid Other | Attending: Obstetrics and Gynecology | Admitting: Obstetrics and Gynecology

## 2018-03-30 DIAGNOSIS — Y92009 Unspecified place in unspecified non-institutional (private) residence as the place of occurrence of the external cause: Secondary | ICD-10-CM

## 2018-03-30 DIAGNOSIS — Z79899 Other long term (current) drug therapy: Secondary | ICD-10-CM | POA: Insufficient documentation

## 2018-03-30 DIAGNOSIS — O10013 Pre-existing essential hypertension complicating pregnancy, third trimester: Secondary | ICD-10-CM | POA: Insufficient documentation

## 2018-03-30 DIAGNOSIS — W19XXXA Unspecified fall, initial encounter: Secondary | ICD-10-CM

## 2018-03-30 DIAGNOSIS — Z3A34 34 weeks gestation of pregnancy: Secondary | ICD-10-CM | POA: Insufficient documentation

## 2018-03-30 DIAGNOSIS — O368130 Decreased fetal movements, third trimester, not applicable or unspecified: Principal | ICD-10-CM | POA: Insufficient documentation

## 2018-03-30 NOTE — OB Triage Note (Signed)
Recvd pt from ED. Pt states she slipped in the rain and fell to your knees and has not felt baby move since 2100. Pt denies vaginal bleeding, LOF or pain. States she just wanted to make sure everything was ok.

## 2018-03-31 DIAGNOSIS — Z79899 Other long term (current) drug therapy: Secondary | ICD-10-CM | POA: Diagnosis not present

## 2018-03-31 DIAGNOSIS — Y92009 Unspecified place in unspecified non-institutional (private) residence as the place of occurrence of the external cause: Secondary | ICD-10-CM

## 2018-03-31 DIAGNOSIS — O10013 Pre-existing essential hypertension complicating pregnancy, third trimester: Secondary | ICD-10-CM | POA: Diagnosis not present

## 2018-03-31 DIAGNOSIS — W19XXXA Unspecified fall, initial encounter: Secondary | ICD-10-CM

## 2018-03-31 DIAGNOSIS — O368130 Decreased fetal movements, third trimester, not applicable or unspecified: Secondary | ICD-10-CM | POA: Diagnosis present

## 2018-03-31 DIAGNOSIS — Z3A34 34 weeks gestation of pregnancy: Secondary | ICD-10-CM | POA: Diagnosis not present

## 2018-03-31 MED ORDER — OXYCODONE-ACETAMINOPHEN 5-325 MG PO TABS: 1.0000 | ORAL_TABLET | ORAL | Status: DC | PRN

## 2018-03-31 MED ORDER — ACETAMINOPHEN 325 MG PO TABS: 650.0000 mg | ORAL_TABLET | ORAL | Status: DC | PRN

## 2018-03-31 MED ORDER — LACTATED RINGERS IV SOLN: 500.0000 mL | INTRAVENOUS | Status: DC | PRN

## 2018-03-31 NOTE — Progress Notes (Signed)
[name] is a 27 y.o. female. She is at 7052w1d gestation. Patient's last menstrual period was 08/04/2017. Estimated Date of Delivery: 05/11/18  [facility]    Chief complaint:Fall on both knees at 2100 in the garage getting out of car. Had not felt baby move and came in to be checked.   Location:bilat knees Onset/timing:2100 Duration:2.5 hours of monitoring done Quality: pain is gone from bilat knees Severity: none Aggravating or alleviating conditions: Associated signs/symptoms:No S/S of labor including UC's that are painful, VB, decreased FM or LOF.  Context:After a fall S: Resting comfortably. no CTX, no VB.no LOF,  Active fetal movement.  Maternal Medical History:   Past Medical History:  Diagnosis Date  . Hypertension   . Renal disorder     Past Surgical History:  Procedure Laterality Date  . BLADDER SURGERY      No Known Allergies  Prior to Admission medications   Medication Sig Start Date End Date Taking? Authorizing Provider  acetaminophen (TYLENOL) 500 MG tablet Take 1,000 mg by mouth every 6 (six) hours as needed.   Yes [provider]  Prenatal Vit-Fe Fumarate-FA (PRENATAL MULTIVITAMIN) TABS tablet Take 1 tablet by mouth daily at 12 noon.   Yes [provider]  amoxicillin (AMOXIL) 500 MG capsule Take 1 capsule (500 mg total) by mouth 3 (three) times daily. 05/25/17   Joni ReiningSmith, Ronald K, PA-C  cetirizine (ZYRTEC) 10 MG tablet Take 1 tablet (10 mg total) by mouth daily. 08/03/17   Cuthriell, Delorise RoyalsJonathan D, PA-C  diphenhydrAMINE (BENADRYL) 25 mg capsule Take 2 capsules (50 mg total) by mouth every 6 (six) hours as needed. 12/18/16   Sharman CheekStafford, Phillip, MD  etonogestrel (IMPLANON) 68 MG IMPL implant Inject 1 each into the skin once.    [provider]  fluticasone (FLONASE) 50 MCG/ACT nasal spray Place 1 spray into both nostrils 2 (two) times daily. 08/03/17   Cuthriell, Delorise RoyalsJonathan D, PA-C  ibuprofen (ADVIL,MOTRIN) 600  MG tablet Take 1 tablet (600 mg total) by mouth every 8 (eight) hours as needed. 05/25/17   Joni ReiningSmith, Ronald K, PA-C  meclizine (ANTIVERT) 32 MG tablet Take 1 tablet (32 mg total) by mouth 3 (three) times daily as needed. 08/03/17   Cuthriell, Delorise RoyalsJonathan D, PA-C  metoCLOPramide (REGLAN) 10 MG tablet Take 1 tablet (10 mg total) by mouth every 6 (six) hours as needed. 12/18/16   Sharman CheekStafford, Phillip, MD  naproxen (NAPROSYN) 500 MG tablet Take 1 tablet (500 mg total) by mouth 2 (two) times daily with a meal. 12/18/16   Sharman CheekStafford, Phillip, MD  ondansetron (ZOFRAN ODT) 4 MG disintegrating tablet Take 1 tablet (4 mg total) by mouth every 8 (eight) hours as needed for nausea or vomiting. 06/29/16   Minna AntisPaduchowski, Kevin, MD  traMADol (ULTRAM) 50 MG tablet Take 1 tablet (50 mg total) by mouth every 6 (six) hours as needed. 05/25/17 05/25/18  Joni ReiningSmith, Ronald K, PA-C     Social History: She  reports that she has never smoked. She has never used smokeless tobacco. She reports that she does not drink alcohol or use drugs.  Family History: family history is not on file.  no history of gyn cancers  Review of Systems: A full review of systems was performed and negative except as noted in the HPI.     O:  BP 137/63 (BP Location: Left Arm)   Pulse 80   Temp 97.8 F (36.6 C) (Oral)   Resp 16   LMP 08/04/2017  No results found for this or any previous visit (from the past 48 hour(s)).   Constitutional: NAD, AAOx3  HE/ENT: extraocular movements grossly intact, moist mucous membranes CV: RRR PULM: nl respiratory effortL     Abd: gravid, non-tender, non-distended, soft      Ext: Non-tender, NonedmeatousKnees: no evidence of trauma or hematoma  Psych: mood appropriate, speech normal Pelvic deferred  NST: reactive  Baseline: 120 Variability: moderate Accelerations present x >2 Decelerations absent Time    A/P: 27 y.o. [redacted]w[redacted]d here for antenatal surveillance for fall at 2100 onto knees  Labor: not present.    Fetal Wellbeing: Reassuring Cat 1 tracing.  Reactive NST   D/c home stable at 0100, precautions reviewed, follow-up as scheduled.   ----- Myrtie Cruise, MSN, CNM, FNP Certified Nurse Midwife [facility]

## 2018-05-01 ENCOUNTER — Other Ambulatory Visit: Payer: Self-pay | Admitting: Obstetrics and Gynecology

## 2018-05-01 NOTE — Progress Notes (Signed)
Orders in 

## 2018-05-16 ENCOUNTER — Other Ambulatory Visit: Payer: Self-pay | Admitting: Certified Nurse Midwife

## 2018-05-16 ENCOUNTER — Encounter: Payer: Self-pay | Admitting: Certified Nurse Midwife

## 2018-05-16 NOTE — Progress Notes (Signed)
History & Labs  [id number], dated by LMP (08/04/2017) c/w 584w4d ultrasound (12/22/2017), planning for induction at 5215w0d for late term pregnancy.   Pregnancy Issues: 1. Hx of gHTN vs preeclampsia, on 81mg  aspirin 2. 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

## 2018-05-17 ENCOUNTER — Inpatient Hospital Stay
Admission: EM | Admit: 2018-05-17 | Discharge: 2018-05-20 | DRG: 807 | Disposition: A | Discharge status: Home / Self Care | Payer: Medicaid Other | Attending: Certified Nurse Midwife | Admitting: Certified Nurse Midwife

## 2018-05-17 DIAGNOSIS — D649 Anemia, unspecified: Secondary | ICD-10-CM | POA: Diagnosis present

## 2018-05-17 DIAGNOSIS — Z3A41 41 weeks gestation of pregnancy: Secondary | ICD-10-CM

## 2018-05-17 DIAGNOSIS — O26893 Other specified pregnancy related conditions, third trimester: Secondary | ICD-10-CM | POA: Diagnosis present

## 2018-05-17 DIAGNOSIS — O480 Post-term pregnancy: Secondary | ICD-10-CM | POA: Diagnosis present

## 2018-05-17 DIAGNOSIS — Z87440 Personal history of urinary (tract) infections: Secondary | ICD-10-CM

## 2018-05-17 DIAGNOSIS — O9902 Anemia complicating childbirth: Secondary | ICD-10-CM | POA: Diagnosis present

## 2018-05-17 DIAGNOSIS — Z6791 Unspecified blood type, Rh negative: Secondary | ICD-10-CM | POA: Diagnosis not present

## 2018-05-17 DIAGNOSIS — Z3490 Encounter for supervision of normal pregnancy, unspecified, unspecified trimester: Secondary | ICD-10-CM | POA: Diagnosis present

## 2018-05-17 MED ORDER — BUTORPHANOL TARTRATE 2 MG/ML IJ SOLN
1.0000 mg | INTRAMUSCULAR | Status: DC | PRN
  Administered 2018-05-18: 1 mg via INTRAVENOUS
  Filled 2018-05-17: qty 1

## 2018-05-17 MED ORDER — SODIUM CHLORIDE 0.9 % IV SOLN
5.0000 10*6.[IU] | Freq: Once | INTRAVENOUS | Status: AC
  Administered 2018-05-18: 5 10*6.[IU] via INTRAVENOUS
  Filled 2018-05-17 (×2): qty 5

## 2018-05-17 MED ORDER — OXYTOCIN 40 UNITS IN LACTATED RINGERS INFUSION - SIMPLE MED
2.5000 [IU]/h | INTRAVENOUS | Status: DC
  Administered 2018-05-18: 2.5 [IU]/h via INTRAVENOUS
  Filled 2018-05-17: qty 1000

## 2018-05-17 MED ORDER — ACETAMINOPHEN 325 MG PO TABS: 650.0000 mg | ORAL_TABLET | ORAL | Status: DC | PRN

## 2018-05-17 MED ORDER — PENICILLIN G POT IN DEXTROSE 60000 UNIT/ML IV SOLN: 3.0000 10*6.[IU] | INTRAVENOUS | Status: DC

## 2018-05-17 MED ORDER — SOD CITRATE-CITRIC ACID 500-334 MG/5ML PO SOLN
30.0000 mL | ORAL | Status: DC | PRN
Start: 2018-05-17 — End: 2018-05-18

## 2018-05-17 MED ORDER — LIDOCAINE HCL (PF) 1 % IJ SOLN: 30.0000 mL | INTRAMUSCULAR | Status: DC | PRN

## 2018-05-17 MED ORDER — TERBUTALINE SULFATE 1 MG/ML IJ SOLN: 0.2500 mg | Freq: Once | INTRAMUSCULAR | Status: DC | PRN

## 2018-05-17 MED ORDER — OXYTOCIN BOLUS FROM INFUSION
500.0000 mL | Freq: Once | INTRAVENOUS | Status: AC
  Administered 2018-05-18: 500 mL via INTRAVENOUS

## 2018-05-17 MED ORDER — ONDANSETRON HCL 4 MG/2ML IJ SOLN
4.0000 mg | Freq: Four times a day (QID) | INTRAMUSCULAR | Status: DC | PRN
  Filled 2018-05-17: qty 2

## 2018-05-17 MED ORDER — LACTATED RINGERS IV SOLN: 500.0000 mL | INTRAVENOUS | Status: DC | PRN

## 2018-05-17 MED ORDER — MISOPROSTOL 25 MCG QUARTER TABLET
25.0000 ug | ORAL_TABLET | ORAL | Status: DC | PRN
  Administered 2018-05-18: 25 ug via VAGINAL
  Filled 2018-05-17: qty 1

## 2018-05-17 MED ORDER — LACTATED RINGERS IV SOLN
INTRAVENOUS | Status: DC
  Administered 2018-05-18: via INTRAVENOUS

## 2018-05-17 MED ORDER — MISOPROSTOL 25 MCG QUARTER TABLET
25.0000 ug | ORAL_TABLET | Freq: Once | ORAL | Status: AC
Start: 2018-05-18 — End: 2018-05-18
  Administered 2018-05-18: 25 ug via BUCCAL
  Filled 2018-05-17: qty 1

## 2018-05-18 ENCOUNTER — Other Ambulatory Visit: Payer: Self-pay

## 2018-05-18 ENCOUNTER — Inpatient Hospital Stay: Payer: Medicaid Other | Admitting: Anesthesiology

## 2018-05-18 ENCOUNTER — Encounter: Payer: Self-pay | Admitting: Obstetrics and Gynecology

## 2018-05-18 LAB — BPAM RBC
Blood Product Expiration Date: 201908222359
Blood Product Expiration Date: 201908222359
Unit Type and Rh: 600
Unit Type and Rh: 600

## 2018-05-18 LAB — CBC
HEMATOCRIT: 30.9 % — AB (ref 35.0–47.0)
Hemoglobin: 10.3 g/dL — ABNORMAL LOW (ref 12.0–16.0)
MCH: 26.5 pg (ref 26.0–34.0)
MCHC: 33.4 g/dL (ref 32.0–36.0)
MCV: 79.3 fL — ABNORMAL LOW (ref 80.0–100.0)
Platelets: 210 10*3/uL (ref 150–440)
RBC: 3.9 MIL/uL (ref 3.80–5.20)
RDW: 15.3 % — ABNORMAL HIGH (ref 11.5–14.5)
WBC: 7.7 10*3/uL (ref 3.6–11.0)

## 2018-05-18 LAB — TYPE AND SCREEN
ABO/RH(D): A NEG
Antibody Screen: POSITIVE
UNIT DIVISION: 0
Unit division: 0

## 2018-05-18 MED ORDER — FENTANYL 2.5 MCG/ML W/ROPIVACAINE 0.15% IN NS 100 ML EPIDURAL (ARMC): 12.0000 mL/h | EPIDURAL | Status: DC

## 2018-05-18 MED ORDER — SIMETHICONE 80 MG PO CHEW: 160.0000 mg | CHEWABLE_TABLET | Freq: Four times a day (QID) | ORAL | Status: DC | PRN

## 2018-05-18 MED ORDER — PRENATAL MULTIVITAMIN CH
1.0000 | ORAL_TABLET | Freq: Every day | ORAL | Status: DC
  Administered 2018-05-18 – 2018-05-20 (×3): 1 via ORAL
  Filled 2018-05-18 (×3): qty 1

## 2018-05-18 MED ORDER — DIPHENHYDRAMINE HCL 50 MG/ML IJ SOLN: 12.5000 mg | INTRAMUSCULAR | Status: DC | PRN

## 2018-05-18 MED ORDER — OXYTOCIN 10 UNIT/ML IJ SOLN
INTRAMUSCULAR | Status: AC
  Filled 2018-05-18: qty 2

## 2018-05-18 MED ORDER — AMMONIA AROMATIC IN INHA
RESPIRATORY_TRACT | Status: AC
  Filled 2018-05-18: qty 10

## 2018-05-18 MED ORDER — WITCH HAZEL-GLYCERIN EX PADS: 1.0000 "application " | MEDICATED_PAD | CUTANEOUS | Status: DC | PRN

## 2018-05-18 MED ORDER — ACETAMINOPHEN 325 MG PO TABS: 650.0000 mg | ORAL_TABLET | ORAL | Status: DC | PRN

## 2018-05-18 MED ORDER — DIPHENHYDRAMINE HCL 25 MG PO CAPS: 25.0000 mg | ORAL_CAPSULE | Freq: Four times a day (QID) | ORAL | Status: DC | PRN

## 2018-05-18 MED ORDER — LIDOCAINE-EPINEPHRINE (PF) 1.5 %-1:200000 IJ SOLN
INTRAMUSCULAR | Status: DC | PRN
  Administered 2018-05-18: 3 mL via EPIDURAL

## 2018-05-18 MED ORDER — DIBUCAINE 1 % RE OINT: 1.0000 "application " | TOPICAL_OINTMENT | RECTAL | Status: DC | PRN

## 2018-05-18 MED ORDER — EPHEDRINE 5 MG/ML INJ: 10.0000 mg | INTRAVENOUS | Status: DC | PRN

## 2018-05-18 MED ORDER — SENNOSIDES-DOCUSATE SODIUM 8.6-50 MG PO TABS
2.0000 | ORAL_TABLET | ORAL | Status: DC
  Administered 2018-05-18 – 2018-05-19 (×2): 2 via ORAL
  Filled 2018-05-18 (×3): qty 2

## 2018-05-18 MED ORDER — LIDOCAINE HCL (PF) 1 % IJ SOLN
INTRAMUSCULAR | Status: DC | PRN
  Administered 2018-05-18: 4 mL via SUBCUTANEOUS

## 2018-05-18 MED ORDER — BENZOCAINE-MENTHOL 20-0.5 % EX AERO: 1.0000 "application " | INHALATION_SPRAY | CUTANEOUS | Status: DC | PRN

## 2018-05-18 MED ORDER — PHENYLEPHRINE 40 MCG/ML (10ML) SYRINGE FOR IV PUSH (FOR BLOOD PRESSURE SUPPORT): 80.0000 ug | PREFILLED_SYRINGE | INTRAVENOUS | Status: DC | PRN

## 2018-05-18 MED ORDER — FENTANYL 2.5 MCG/ML W/ROPIVACAINE 0.15% IN NS 100 ML EPIDURAL (ARMC)
EPIDURAL | Status: AC
  Filled 2018-05-18: qty 100

## 2018-05-18 MED ORDER — IBUPROFEN 600 MG PO TABS
600.0000 mg | ORAL_TABLET | Freq: Four times a day (QID) | ORAL | Status: DC
  Administered 2018-05-18 – 2018-05-20 (×8): 600 mg via ORAL
  Filled 2018-05-18 (×9): qty 1

## 2018-05-18 MED ORDER — ONDANSETRON HCL 4 MG/2ML IJ SOLN: 4.0000 mg | INTRAMUSCULAR | Status: DC | PRN

## 2018-05-18 MED ORDER — LIDOCAINE HCL (PF) 1 % IJ SOLN
INTRAMUSCULAR | Status: AC
  Filled 2018-05-18: qty 30

## 2018-05-18 MED ORDER — LACTATED RINGERS IV SOLN
500.0000 mL | Freq: Once | INTRAVENOUS | Status: AC
  Administered 2018-05-18: 500 mL via INTRAVENOUS

## 2018-05-18 MED ORDER — COCONUT OIL OIL: 1.0000 "application " | TOPICAL_OIL | Status: DC | PRN

## 2018-05-18 MED ORDER — ONDANSETRON HCL 4 MG PO TABS: 4.0000 mg | ORAL_TABLET | ORAL | Status: DC | PRN

## 2018-05-18 MED ORDER — MISOPROSTOL 200 MCG PO TABS
ORAL_TABLET | ORAL | Status: AC
  Administered 2018-05-18: 25 ug via BUCCAL
  Filled 2018-05-18: qty 4

## 2018-05-18 NOTE — Anesthesia Procedure Notes (Signed)
Epidural Patient location during procedure: OB Start time: 05/18/2018 5:17 AM End time: 05/18/2018 5:25 AM  Staffing Anesthesiologist: Lenard SimmerKarenz, Jeremie Giangrande, MD Performed: anesthesiologist   Preanesthetic Checklist Completed: patient identified, site marked, surgical consent, pre-op evaluation, timeout performed, IV checked, risks and benefits discussed and monitors and equipment checked  Epidural Patient position: sitting Prep: ChloraPrep Patient monitoring: heart rate, continuous pulse ox and blood pressure Approach: midline Location: L3-L4 Injection technique: LOR saline  Needle:  Needle type: Tuohy  Needle gauge: 17 G Needle length: 9 cm and 9 Needle insertion depth: 6.5 cm Catheter type: closed end flexible Catheter size: 19 Gauge Catheter at skin depth: 11 cm Test dose: negative and 1.5% lidocaine with Epi 1:200 K  Assessment Sensory level: T10 Events: blood not aspirated, injection not painful, no injection resistance, negative IV test and no paresthesia  Additional Notes 1st attempt Pt. Evaluated and documentation done after procedure finished. Patient identified. Risks/Benefits/Options discussed with patient including but not limited to bleeding, infection, nerve damage, paralysis, failed block, incomplete pain control, headache, blood pressure changes, nausea, vomiting, reactions to medication both or allergic, itching and postpartum back pain. Confirmed with bedside nurse the patient's most recent platelet count. Confirmed with patient that they are not currently taking any anticoagulation, have any bleeding history or any family history of bleeding disorders. Patient expressed understanding and wished to proceed. All questions were answered. Sterile technique was used throughout the entire procedure. Please see nursing notes for vital signs. Test dose was given through epidural catheter and negative prior to continuing to dose epidural or start infusion. Warning signs of high  block given to the patient including shortness of breath, tingling/numbness in hands, complete motor block, or any concerning symptoms with instructions to call for help. Patient was given instructions on fall risk and not to get out of bed. All questions and concerns addressed with instructions to call with any issues or inadequate analgesia.   Patient tolerated the insertion well without immediate complications.Reason for block:procedure for pain

## 2018-05-18 NOTE — Progress Notes (Signed)
Pt undergoing epidural placement, sitting on bedside, suddenly reported urge to push. [place] FolkM. [place] notified via phone to attend delivery. Pt placed back in bed.

## 2018-05-18 NOTE — Plan of Care (Signed)
Reviewed plan of care with patient. All questions answered, will monitor closely. 

## 2018-05-18 NOTE — Anesthesia Preprocedure Evaluation (Signed)
Anesthesia Evaluation  Patient identified by MRN, date of birth, ID band Patient awake    Reviewed: Allergy & Precautions, H&P , NPO status , Patient's Chart, lab work & pertinent test results, reviewed documented beta blocker date and time   History of Anesthesia Complications Negative for: history of anesthetic complications  Airway Mallampati: III  TM Distance: >3 FB Neck ROM: full    Dental  (+) Dental Advidsory Given, Teeth Intact   Pulmonary neg pulmonary ROS,           Cardiovascular Exercise Tolerance: Good hypertension (pre-eclampsia with prior pregnancy), (-) angina(-) CAD, (-) Past MI, (-) Cardiac Stents and (-) CABG (-) dysrhythmias (-) Valvular Problems/Murmurs     Neuro/Psych negative neurological ROS  negative psych ROS   GI/Hepatic Neg liver ROS, GERD  ,  Endo/Other  negative endocrine ROS  Renal/GU Renal disease  negative genitourinary   Musculoskeletal   Abdominal   Peds  Hematology negative hematology ROS (+)   Anesthesia Other Findings Past Medical History: No date: Hypertension No date: Renal disorder   Reproductive/Obstetrics (+) Pregnancy                             Anesthesia Physical Anesthesia Plan  ASA: II  Anesthesia Plan: Epidural   Post-op Pain Management:    Induction:   PONV Risk Score and Plan:   Airway Management Planned:   Additional Equipment:   Intra-op Plan:   Post-operative Plan:   Informed Consent: I have reviewed the patients History and Physical, chart, labs and discussed the procedure including the risks, benefits and alternatives for the proposed anesthesia with the patient or authorized representative who has indicated his/her understanding and acceptance.   Dental Advisory Given  Plan Discussed with: Anesthesiologist, CRNA and Surgeon  Anesthesia Plan Comments:         Anesthesia Quick Evaluation

## 2018-05-18 NOTE — Plan of Care (Signed)
Reviewed plan of care with patient. All questions answered. Will continue to monitor closely. 

## 2018-05-18 NOTE — Discharge Summary (Signed)
Obstetric Discharge Summary   Patient ID: Patient Name: Crystal Patel- DOB: 1991/05/03 MRN: [id number]  Date of Admission: 05/17/2018 Date of Delivery: 05/17/18 Delivered by: Genia Del, CNM Date of Discharge: 05/20/18 Primary OB: [facility]  WUJ:[id number]'W last menstrual period was 08/04/2017 (exact date). EDC Estimated Date of Delivery: 05/11/18 Gestational Age at Delivery: [redacted]w[redacted]d   Antepartum complications:  1.Hx of gHTN vs preeclampsia in first pregnancy, on 81mg  aspirin 2. 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 Encounter for induction of labor 05/17/2018  . Fall at home 03/31/2018  . Arm pain 02/14/2014   Induction: Cytotec Complications: Precipitous labor Intrapartum complications/course: Crystal Patel- presented to L&D for induction of labor. She was induced one dose of misoprostol. IV Stadol x1 given for pain relief. Epidural was placed just before delivery with test dose only. Test dose did not provide any relief prior to delivery. She progressed to complete and had a spontaneous vaginal birth of a live female over an intact perineum. The fetal head was delivered in direct OA position with restitution to ROA. No nuchal cord. Anterior then posterior shoulders delivered with minimal assistance. Baby placed on mom's abdomen and attended to by transition RN. Cord clamped and cut after 2 minute delay by myself. Cord blood obtained for newborn labs.  Delivery Type: spontaneous vaginal delivery Anesthesia: IV Stadol x1, epidural test dose only, local for repair Placenta: sponatneous Laceration: 2nd degree perineal, repaired Episiotomy: none  Newborn Data: Live born female "Paris Brielle" Birth Weight: 6 lb 15.8 oz (3170 g) APGAR: 8, 9  Newborn Delivery   Birth date/time:  05/18/2018  05:27:00 Delivery type:  Vaginal, Spontaneous     Postpartum Course  Patient had an uncomplicated postpartum course. By time of discharge on PPD#2, her pain was controlled on oral pain medications; she had appropriate lochia and was ambulating, voiding without difficulty and tolerating regular diet.  She was deemed stable for discharge to home.       Labs: CBC Latest Ref Rng & Units 05/19/2018 05/18/2018 07/18/2016  WBC 3.6 - 11.0 K/uL 6.8 7.7 5.7  Hemoglobin 12.0 - 16.0 g/dL 9.0(L) 10.3(L) 11.9(L)  Hematocrit 35.0 - 47.0 % 26.8(L) 30.9(L) 34.0(L)  Platelets 150 - 440 K/uL 176 210 191   A NEG  Physical exam:  BP 135/83 (BP Location: Right Arm)   Pulse 67   Temp 97.9 F (36.6 C) (Oral)   Resp 20   Ht 5\' 8"  (1.727 m)   Wt 96.2 kg (212 lb)   LMP 08/04/2017 (Exact Date)   SpO2 100%   Breastfeeding? Unknown   BMI 32.23 kg/m  General: alert and no distress Pulm: normal respiratory effort Lochia: appropriate Abdomen: soft, NT Uterine Fundus: firm, below umbilicus Extremities: No evidence of DVT seen on physical exam. No lower extremity edema.  Disposition: stable, discharge to home Baby Feeding: formula Baby Disposition: home with mom  Contraception: PAragard IUD to be placed at 6 weeks at [facility]  Prenatal Labs:  Blood type/Rh A neg, received RhoGAM 03/12/2018  Antibody screen neg  Rubella Immune  Varicella Non-immune  RPR NR  HBsAg Neg  HIV NR  GC neg  Chlamydia neg  Genetic screening declined  1 hour GTT 91  3 hour GTT n/a  GBS positive   Results for orders placed or performed during the hospital encounter of 05/17/18 (  from the past 72 hour(s))  CBC     Status: Abnormal   Collection Time: 05/18/18 12:03 AM  Result Value Ref Range   WBC 7.7 3.6 - 11.0 K/uL   RBC 3.90 3.80 - 5.20 MIL/uL   Hemoglobin 10.3 (L) 12.0 - 16.0 g/dL   HCT 16.1 (L) 09.6 - 04.5 %   MCV 79.3 (L) 80.0 - 100.0 fL   MCH 26.5 26.0 - 34.0 pg   MCHC 33.4 32.0 - 36.0 g/dL   RDW 40.9 (H) 81.1 -  14.5 %   Platelets 210 150 - 440 K/uL    Comment: Performed at [facility], [address] [place], [place] [address]  RPR     Status: None   Collection Time: 05/18/18 12:03 AM  Result Value Ref Range   RPR Ser Ql Non Reactive Non Reactive    Comment: (NOTE) Performed At: [facility] [address] [place], [place] [id number] Jolene Schimke MD MV:[id number]   Type and screen     Status: None   Collection Time: 05/18/18 12:03 AM  Result Value Ref Range   ABO/RH(D) A NEG    Antibody Screen POS    Sample Expiration 05/21/2018    Antibody Identification PASSIVELY ACQUIRED ANTI-D    Unit Number [id number]    Blood Component Type RED CELLS,LR    Unit division 00    Status of Unit REL FROM [facility]    Transfusion Status OK TO TRANSFUSE    Crossmatch Result      COMPATIBLE Performed at [facility], [address], [place], [place] [address]    Unit Number [id number]    Blood Component Type RED CELLS,LR    Unit division 00    Status of Unit REL FROM [facility]    Transfusion Status OK TO TRANSFUSE    Crossmatch Result COMPATIBLE   Rhogam injection     Status: None (Preliminary result)   Collection Time: 05/19/18  4:23 AM  Result Value Ref Range   Unit Number [id number]    Blood Component Type RHIG    Unit division 00    Status of Unit ISSUED    Transfusion Status      OK TO TRANSFUSE Performed at [facility], [address] [place], [place] [address]   Fetal screen     Status: None   Collection Time: 05/19/18  4:23 AM  Result Value Ref Range   Fetal Screen      NEG Performed at [facility], [address] [place], [place] [address]   CBC     Status: Abnormal   Collection Time: 05/19/18  4:23 AM  Result Value Ref Range   WBC 6.8 3.6 - 11.0 K/uL   RBC 3.39 (L) 3.80 - 5.20 MIL/uL   Hemoglobin 9.0 (L) 12.0 - 16.0 g/dL   HCT 01.0 (L) 93.2 - 35.5 %   MCV 79.2 (L) 80.0 - 100.0 fL   MCH 26.6 26.0 - 34.0 pg   MCHC  33.6 32.0 - 36.0 g/dL   RDW 73.2 (H) 20.2 - 54.2 %   Platelets 176 150 - 440 K/uL    Comment: Performed at [facility], [address] [place], [place] [address]    Rh Immune globulin given:given 05/19/18 Rubella vaccine given: n/a  Varicella vaccine given 05/20/18 Tdap vaccine given in AP or PP setting: 04/14/18 Flu vaccine given in AP or PP setting: n/a  Plan:  Crystal Patel- was discharged to home in  good condition. [facility] with delivery provider in 6 weeks  Discharge Instructions: Per After Visit Summary. Activity: Advance as tolerated. Pelvic rest for 6 weeks.   Diet: Regular Discharge Medications: Allergies as of 05/20/2018   No Known Allergies     Medication List    STOP taking these medications   naproxen 500 MG tablet Commonly known as:  NAPROSYN     TAKE these medications   benzocaine-Menthol 20-0.5 % Aero Commonly known as:  DERMOPLAST Apply 1 application topically as needed for irritation (perineal discomfort).   cetirizine 10 MG tablet Commonly known as:  ZYRTEC Take 1 tablet (10 mg total) by mouth daily.   diphenhydrAMINE 25 mg capsule Commonly known as:  BENADRYL Take 2 capsules (50 mg total) by mouth every 6 (six) hours as needed.   etonogestrel 68 MG Impl implant Commonly known as:  NEXPLANON Inject 1 each into the skin once.   fluticasone 50 MCG/ACT nasal spray Commonly known as:  FLONASE Place 1 spray into both nostrils 2 (two) times daily.   ibuprofen 600 MG tablet Commonly known as:  ADVIL,MOTRIN Take 1 tablet (600 mg total) by mouth every 6 (six) hours. What changed:    when to take this  reasons to take this   meclizine 32 MG tablet Commonly known as:  ANTIVERT Take 1 tablet (32 mg total) by mouth 3 (three) times daily as needed.   metoCLOPramide 10 MG tablet Commonly known as:  REGLAN Take 1 tablet (10 mg total) by mouth every 6 (six) hours as needed.   ondansetron 4 MG  disintegrating tablet Commonly known as:  ZOFRAN ODT Take 1 tablet (4 mg total) by mouth every 8 (eight) hours as needed for nausea or vomiting.   prenatal multivitamin Tabs tablet Take 1 tablet by mouth daily at 12 noon.      Outpatient follow up:  Follow-up Information    Genia DelHaviland, Margaret, CNM. Schedule an appointment as soon as possible for a visit in 6 week(s).   Specialty:  Certified Nurse Midwife Why:  For routine postpartum visit, wants Paragard IUD placement at this visit Contact information: [address] [place] [address] [phone]            Signed: Ihor Austinhomas J Schermerhorn MD

## 2018-05-18 NOTE — H&P (Signed)
OB History & Physical   History of Present Illness:  Chief Complaint:   HPI:  Crystal D Coble-Fuller- is a 27 y.o. G58P1011 female at [redacted]w[redacted]d dated by LMP c/w [redacted]w[redacted]d ultrasound. She presents to L&D for scheduled induction of labor for late term pregnancy.   She reports:  -active fetal movement -no leakage of fluid  -no vaginal bleeding -mild contractions, which she has not been timing or paying much attention to  Pregnancy Issues: 1. Hx of gHTN vs preeclampsia in first pregnancy, on 81mg  aspirin 2. 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 Hypertension   . Renal disorder     Past Surgical History:  Procedure Laterality Date  . BLADDER SURGERY      No Known Allergies  Prior to Admission medications   Medication Sig Start Date End Date Taking? Authorizing Provider  cetirizine (ZYRTEC) 10 MG tablet Take 1 tablet (10 mg total) by mouth daily. 08/03/17   Cuthriell, Delorise Royals, PA-C  diphenhydrAMINE (BENADRYL) 25 mg capsule Take 2 capsules (50 mg total) by mouth every 6 (six) hours as needed. 12/18/16   Sharman Cheek, MD  etonogestrel (IMPLANON) 68 MG IMPL implant Inject 1 each into the skin once.    [provider]  fluticasone (FLONASE) 50 MCG/ACT nasal spray Place 1 spray into both nostrils 2 (two) times daily. 08/03/17   Cuthriell, Delorise Royals, PA-C  ibuprofen (ADVIL,MOTRIN) 600 MG tablet Take 1 tablet (600 mg total) by mouth every 8 (eight) hours as needed. 05/25/17   Joni Reining, PA-C  meclizine (ANTIVERT) 32 MG tablet Take 1 tablet (32 mg total) by mouth 3 (three) times daily as needed. 08/03/17   Cuthriell, Delorise Royals, PA-C  metoCLOPramide (REGLAN) 10 MG tablet Take 1 tablet (10 mg total) by mouth every 6 (six) hours as needed. 12/18/16   Sharman Cheek, MD  naproxen (NAPROSYN) 500 MG tablet Take 1 tablet (500 mg total) by mouth 2 (two) times daily with a meal.  12/18/16   Sharman Cheek, MD  ondansetron (ZOFRAN ODT) 4 MG disintegrating tablet Take 1 tablet (4 mg total) by mouth every 8 (eight) hours as needed for nausea or vomiting. 06/29/16   [name], MD  Prenatal Vit-Fe Fumarate-FA (PRENATAL MULTIVITAMIN) TABS tablet Take 1 tablet by mouth daily at 12 noon.    [provider]     Prenatal care site: [facility]   Social History: She  reports that she has never smoked. She has never used smokeless tobacco. She reports that she does not drink alcohol or use drugs.  Family History: family history includes Lung cancer in her maternal grandmother; Uterine cancer in her paternal grandmother.   Review of Systems: A full review of systems was performed and negative except as noted in the HPI.    Physical Exam:  Vital Signs: BP 123/71 (BP Location: Left Arm)   Pulse 89   Temp 98 F (36.7 C) (Oral)   Resp 16   Ht 5\' 8"  (1.727 m)   Wt 96.2 kg (212 lb)   LMP 08/04/2017   BMI 32.23 kg/m    General:   alert, cooperative, appears stated age and no distress  Skin:  normal and no rash or abnormalities  Neurologic:    Alert & oriented x 3  Lungs:   clear to auscultation bilaterally  Heart:   regular rate and rhythm, S1, S2 normal,  no murmur, click, rub or gallop  Abdomen:  soft, non-tender; bowel sounds normal; no masses,  no organomegaly  Pelvis:  Exam deferred.  FHT:  130 BPM  Presentations: cephalic  Cervix:  per RN   Dilation: FT   Effacement: Long   Station:  -3  Extremities: : non-tender, symmetric, no edema bilaterally.    EFW: 8lb0oz  Results for orders placed or performed during the hospital encounter of 05/17/18 (from the past 24 hour(s))  CBC     Status: Abnormal   Collection Time: 05/18/18 12:03 AM  Result Value Ref Range   WBC 7.7 3.6 - 11.0 K/uL   RBC 3.90 3.80 - 5.20 MIL/uL   Hemoglobin 10.3 (L) 12.0 - 16.0 g/dL   HCT 21.330.9 (L) 08.635.0 - 57.847.0 %   MCV 79.3 (L) 80.0 - 100.0 fL   MCH 26.5 26.0 - 34.0 pg    MCHC 33.4 32.0 - 36.0 g/dL   RDW 46.915.3 (H) 62.911.5 - 52.814.5 %   Platelets 210 150 - 440 K/uL  Type and screen     Status: None (Preliminary result)   Collection Time: 05/18/18 12:03 AM  Result Value Ref Range   ABO/RH(D) PENDING    Antibody Screen PENDING    Sample Expiration      05/21/2018 Performed at [facility], [address] [place], [place] [address]     Pertinent Results:  Prenatal Labs: Blood type/Rh A neg, received RhoGAM 03/12/2018  Antibody screen neg  Rubella Immune  Varicella Non-immune  RPR NR  HBsAg Neg  HIV NR  GC neg  Chlamydia neg  Genetic screening declined  1 hour GTT 91  3 hour GTT n/a  GBS positive   FHT: FHR: 125 bpm, variability: moderate,  accelerations:  Present,  decelerations:  Absent Category/reactivity:  Category I TOCO: regular, every 4-6 minutes   Assessment:  [name] is a 27 y.o. 563P1011 female at 3539w0d with late term pregnancy.   Plan:  1. Admit to Labor & Delivery; consents reviewed and obtained  2. Fetal Well being  - Fetal Tracing: category I - GBS positive, plan to treat prophylactically with Penicillin - Presentation: cephalic confirmed by [facility]   3. Routine OB: - Prenatal labs reviewed, as above - Rh negative, RhoGAM given 03/12/2018 - CBC & T&S on admit - Clear fluids, IVF  4. Induction of Labor -  Contractions to be monitored with external toco in place -  Pelvis proven to 7lb11oz -  Plan for induction with misoprostol -  Plan for continuous fetal monitoring  -  Maternal pain control as desired: IVPM, nitrous, regional anesthesia -  Anticipate vaginal delivery   Genia DelMargaret Mckynna Vanloan, CNM 05/18/2018 1:08 AM ----- Genia DelMargaret La Dibella Certified Nurse Midwife [facility], Department of OB/GYN [facility]

## 2018-05-18 NOTE — Discharge Instructions (Signed)

## 2018-05-19 LAB — CBC
HEMATOCRIT: 26.8 % — AB (ref 35.0–47.0)
Hemoglobin: 9 g/dL — ABNORMAL LOW (ref 12.0–16.0)
MCH: 26.6 pg (ref 26.0–34.0)
MCHC: 33.6 g/dL (ref 32.0–36.0)
MCV: 79.2 fL — AB (ref 80.0–100.0)
PLATELETS: 176 10*3/uL (ref 150–440)
RBC: 3.39 MIL/uL — ABNORMAL LOW (ref 3.80–5.20)
RDW: 15.5 % — AB (ref 11.5–14.5)
WBC: 6.8 10*3/uL (ref 3.6–11.0)

## 2018-05-19 LAB — RPR: RPR: NONREACTIVE

## 2018-05-19 LAB — FETAL SCREEN: Fetal Screen: NEGATIVE

## 2018-05-19 MED ORDER — RHO D IMMUNE GLOBULIN 1500 UNIT/2ML IJ SOSY
300.0000 ug | PREFILLED_SYRINGE | Freq: Once | INTRAMUSCULAR | Status: AC
  Administered 2018-05-19: 300 ug via INTRAVENOUS
  Filled 2018-05-19: qty 2

## 2018-05-19 NOTE — Anesthesia Postprocedure Evaluation (Signed)
Anesthesia Post Note  Patient: Crystal Patel-  Procedure(s) Performed: AN AD HOC LABOR EPIDURAL  Patient location during evaluation: Mother Baby Anesthesia Type: Epidural Level of consciousness: awake and alert Pain management: pain level controlled Vital Signs Assessment: post-procedure vital signs reviewed and stable Respiratory status: spontaneous breathing, nonlabored ventilation and respiratory function stable Cardiovascular status: stable Postop Assessment: no headache, no backache and epidural receding Anesthetic complications: no     Last Vitals:  Vitals:   05/18/18 2307 05/19/18 0825  BP: (!) 112/55 112/62  Pulse: 70 68  Resp: 18 18  Temp: 36.6 C 36.7 C  SpO2: 100% 100%    Last Pain:  Vitals:   05/19/18 1026  TempSrc:   PainSc: 0-No pain                 Rica MastBachich,  Harbert Fitterer M

## 2018-05-20 MED ORDER — VARICELLA VIRUS VACCINE LIVE 1350 PFU/0.5ML IJ SUSR
0.5000 mL | Freq: Once | INTRAMUSCULAR | Status: DC
Start: 2018-05-20 — End: 2018-05-20

## 2018-05-20 MED ORDER — IBUPROFEN 600 MG PO TABS: 600.0000 mg | ORAL_TABLET | Freq: Four times a day (QID) | ORAL | 0 refills | Status: AC

## 2018-05-20 MED ORDER — BENZOCAINE-MENTHOL 20-0.5 % EX AERO: 1.0000 "application " | INHALATION_SPRAY | CUTANEOUS | 1 refills | Status: AC | PRN

## 2018-05-20 NOTE — Progress Notes (Signed)
D/C instructions provided, pt states understanding, aware of follow up appt.      

## 2018-05-20 NOTE — Progress Notes (Signed)
Post Partum Day 1 Subjective: no complaints, up ad lib and voiding  Objective: Blood pressure 124/60, pulse 64, temperature 98 F (36.7 C), temperature source Oral, resp. rate 18, height 5\' 8"  (1.727 m), weight 96.2 kg (212 lb), last menstrual period 08/04/2017, SpO2 100 %, unknown if currently breastfeeding.  Physical Exam:  General: alert and appears stated age Lochia: appropriate Uterine Fundus: firm Incision: intact  Recent Labs    05/18/18 0003 05/19/18 0423  HGB 10.3* 9.0*  HCT 30.9* 26.8*    Assessment/Plan: Anemia: po iron Normal postpartum care   LOS: 3 days   Christeen DouglasBethany Ceferino Lang 05/19/18, 10:01PM

## 2018-05-20 NOTE — Progress Notes (Signed)
D/C home to car via auxiliary in wheelchair.  

## 2018-05-22 LAB — RHOGAM INJECTION: UNIT DIVISION: 0

## 2018-06-15 IMAGING — US US OB COMP LESS 14 WK
1 series · 13 of 28 positions shown · non-contrast
Comparison: Pelvic ultrasound performed 06/02/2011

CLINICAL DATA: Acute onset of vaginal bleeding.  Initial encounter.

EXAM:
OBSTETRIC <14 WK US AND TRANSVAGINAL OB US
TECHNIQUE: Both transabdominal and transvaginal ultrasound examinations were
performed for complete evaluation of the gestation as well as the
maternal uterus, adnexal regions, and pelvic cul-de-sac.
Transvaginal technique was performed to assess early pregnancy.

[Series 1: us ob comp less 14 wk · 13 of 105 slices shown]
[im 4/105]
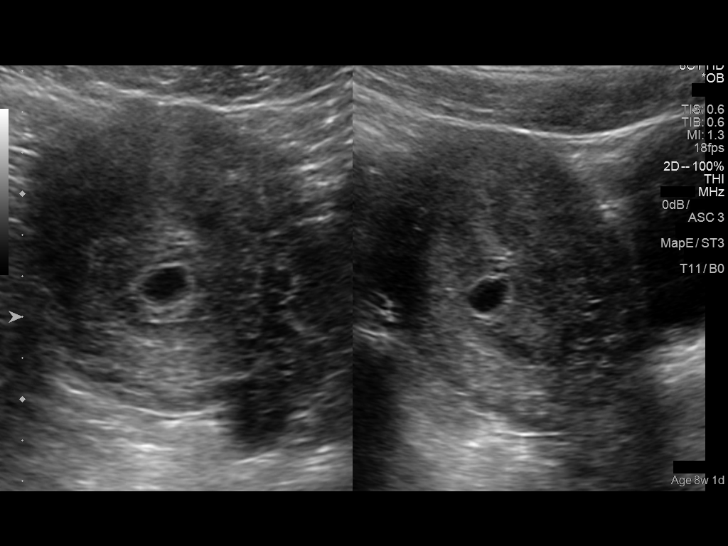
[im 12/105]
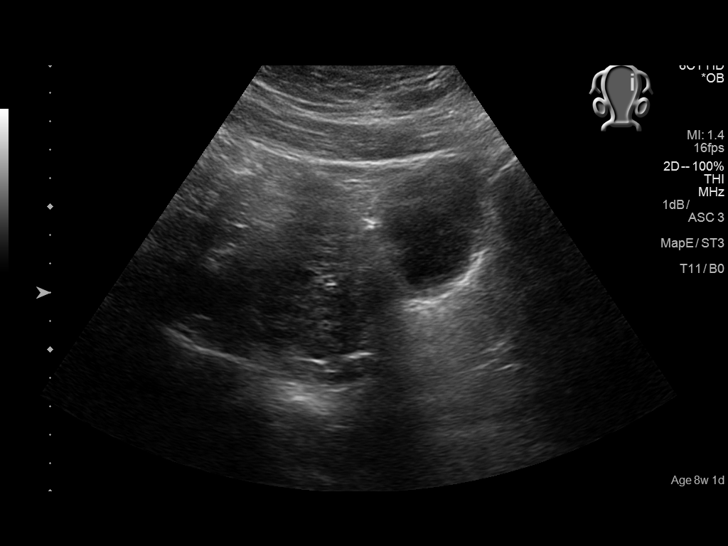
[im 20/105]
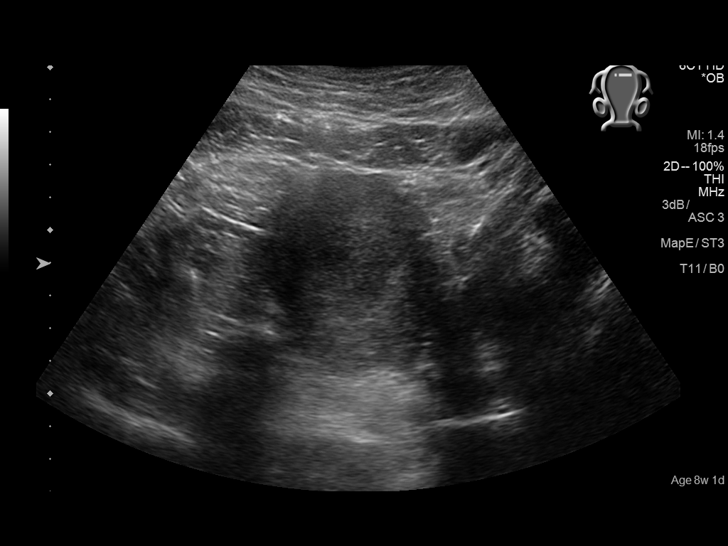
[im 27/105]
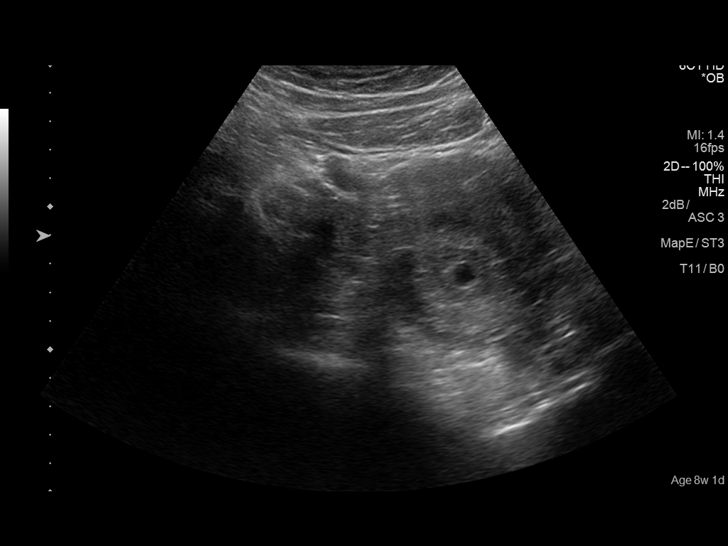
[im 35/105]
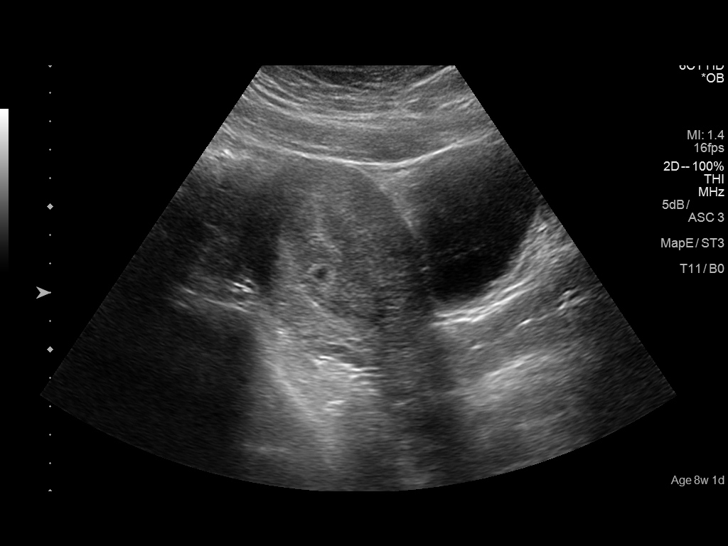
[im 43/105]
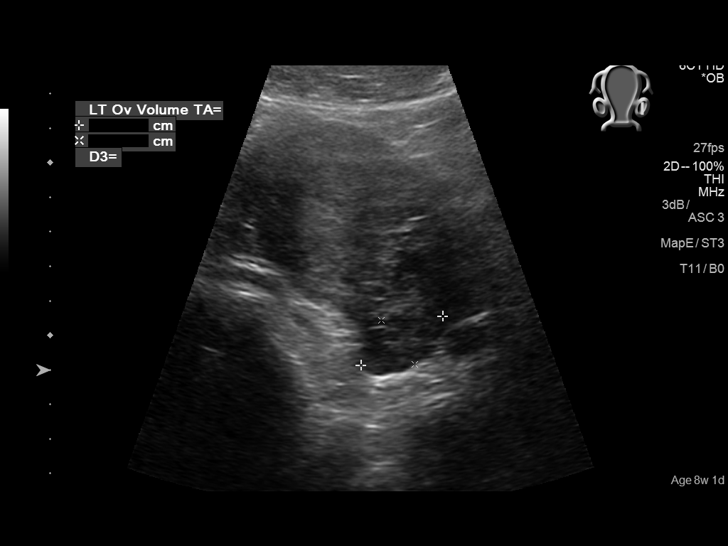
[im 54/105]
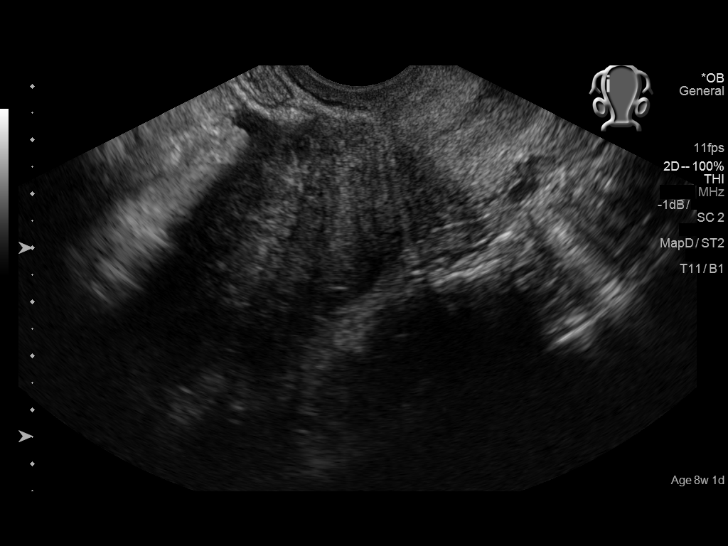
[im 62/105]
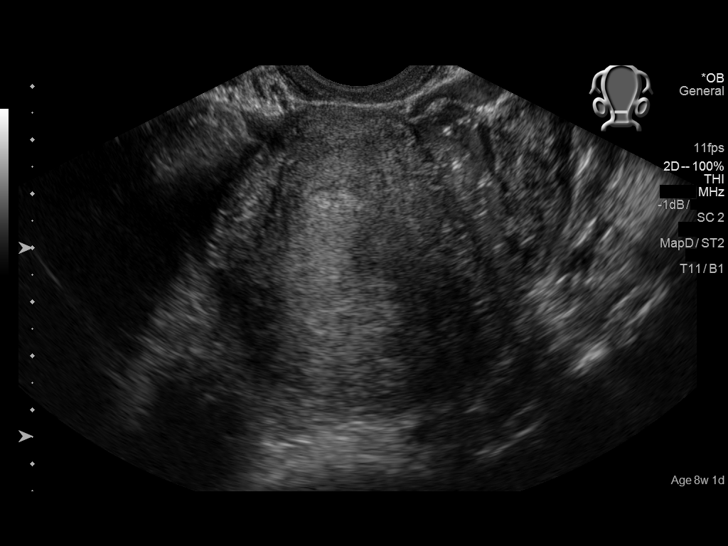
[im 70/105]
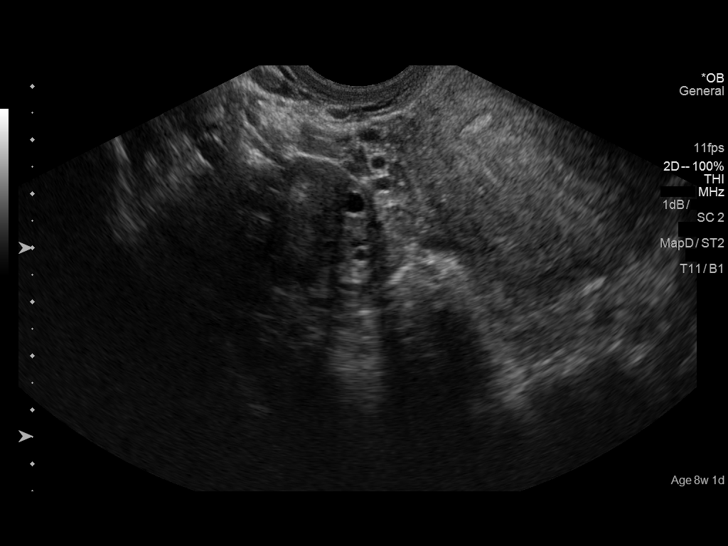
[im 78/105]
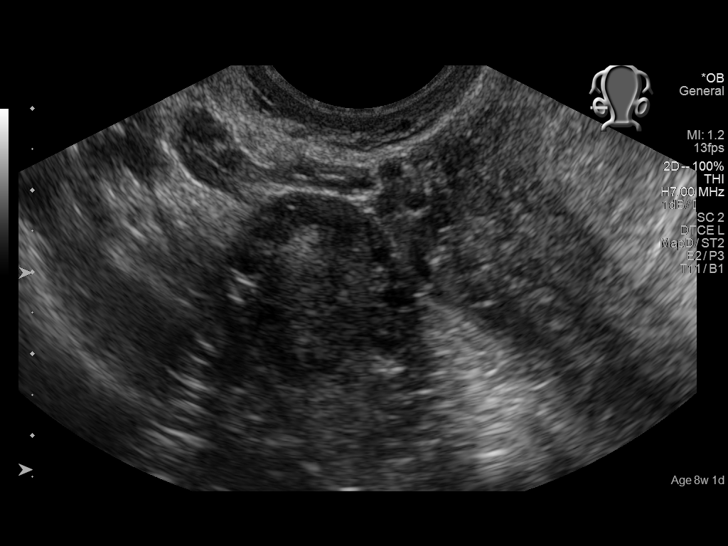
[im 85/105]
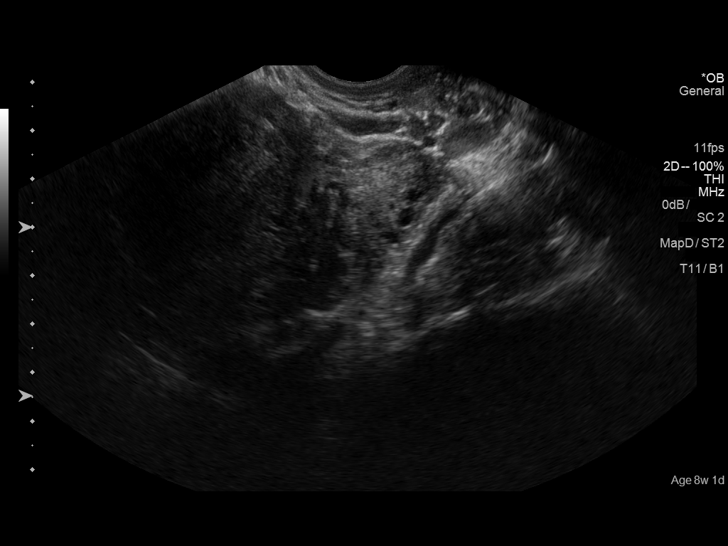
[im 93/105]
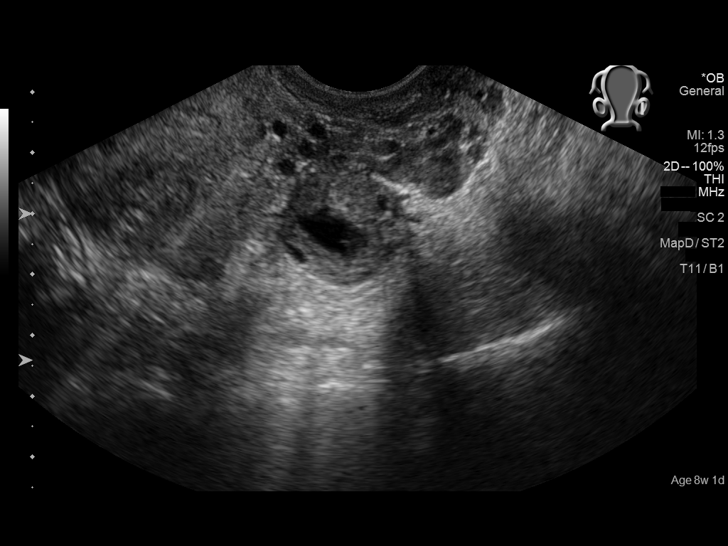
[im 101/105]
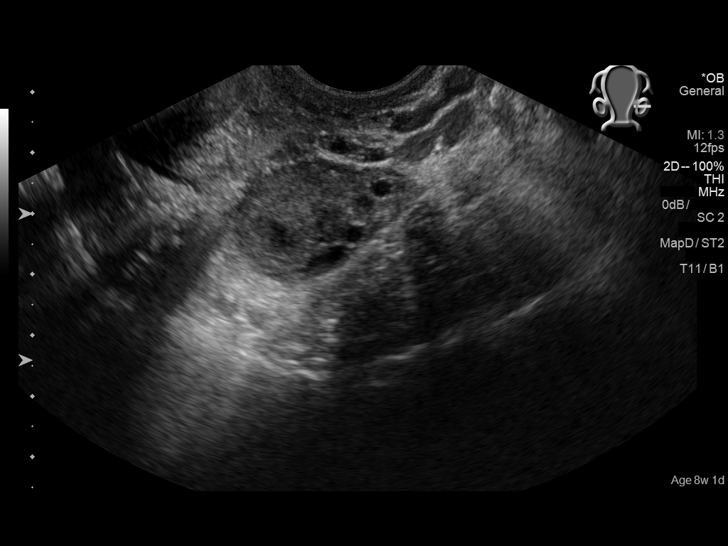

[13 of 28 positions shown; findings below may reference images not displayed]

FINDINGS: Intrauterine gestational sac: Single; visualized and normal in
shape.

Yolk sac:  No

Embryo:  No

Cardiac Activity: N/A

MSD: 0.9 cm   5 w   5  d

Subchorionic hemorrhage:  None visualized.

Maternal uterus/adnexae: The uterus is otherwise unremarkable in
appearance.

The ovaries are within normal limits. The right ovary measures 3.8 x
2.0 x 2.2 cm, while the left ovary measures 3.4 x 3.2 x 1.9 cm. No
suspicious adnexal masses are seen; there is no evidence for ovarian
torsion.

No free fluid is seen within the pelvic cul-de-sac.
IMPRESSION: Single intrauterine gestational sac noted, with a mean sac diameter
of 0.9 cm, corresponding to a gestational age of 5 weeks 5 days.
This does not match the gestational age by LMP, though it remains
too early to determine a new estimated date of delivery. No yolk sac
or embryo is yet seen, still within normal limits. If the patient's
quantitative beta HCG level continues to trend upward, would perform
follow-up pelvic ultrasound in 2 weeks for further evaluation.

## 2018-06-21 IMAGING — US US OB TRANSVAGINAL
1 series · 13 of 28 positions shown · non-contrast
Comparison: 07/11/2016.

CLINICAL DATA: Vaginal spotting, cramping.

EXAM:
OBSTETRIC <14 WK US AND TRANSVAGINAL OB US
TECHNIQUE: Both transabdominal and transvaginal ultrasound examinations were
performed for complete evaluation of the gestation as well as the
maternal uterus, adnexal regions, and pelvic cul-de-sac.
Transvaginal technique was performed to assess early pregnancy.

[Series 1: us ob transvaginal · 0.18mm/px · 13 of 89 slices shown]
[im 4/89]
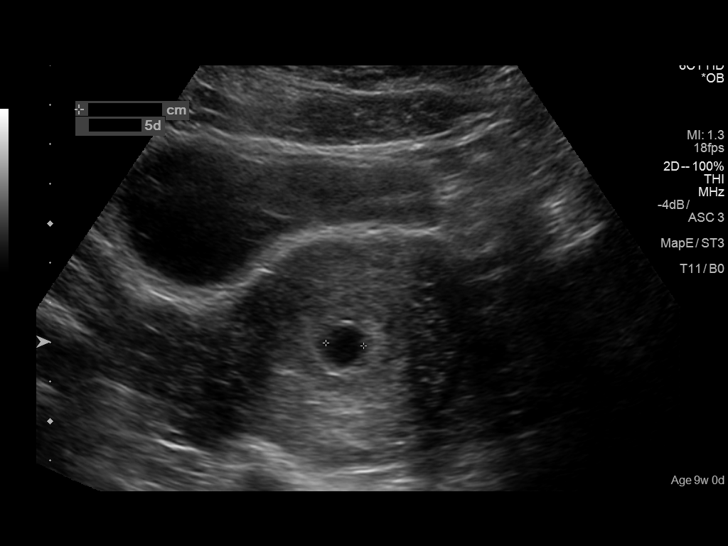
[im 10/89]
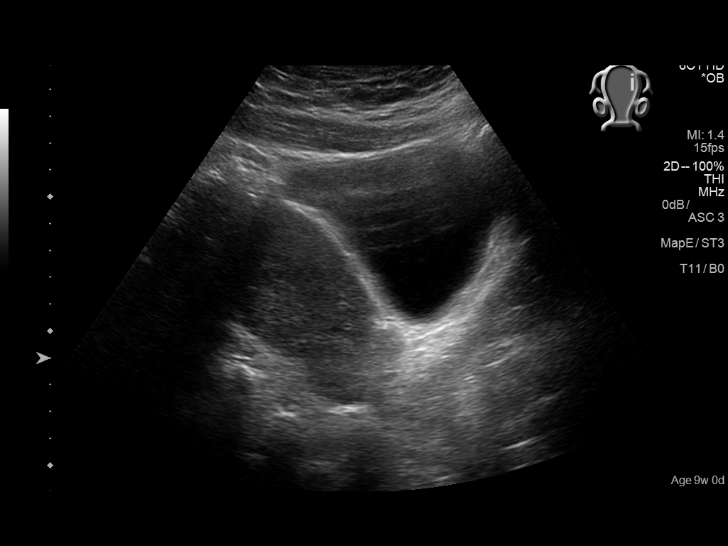
[im 17/89]
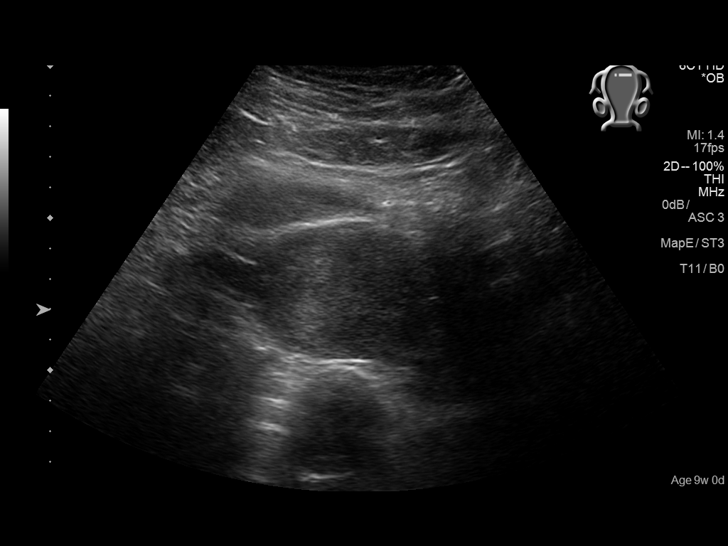
[im 23/89]
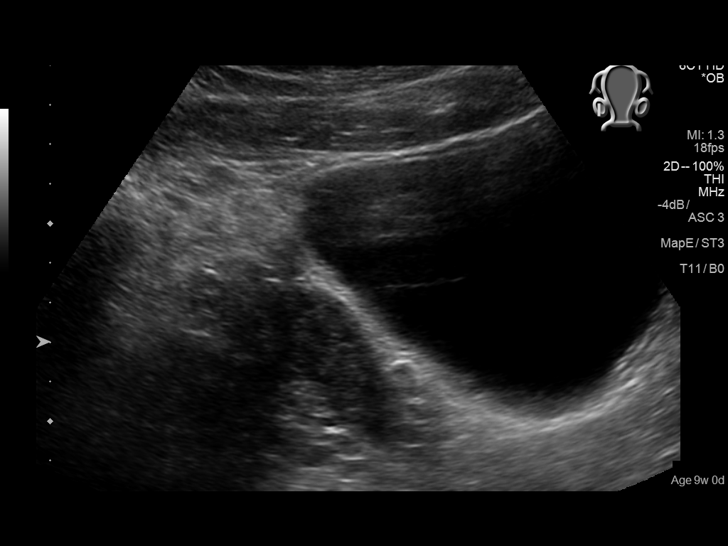
[im 30/89]
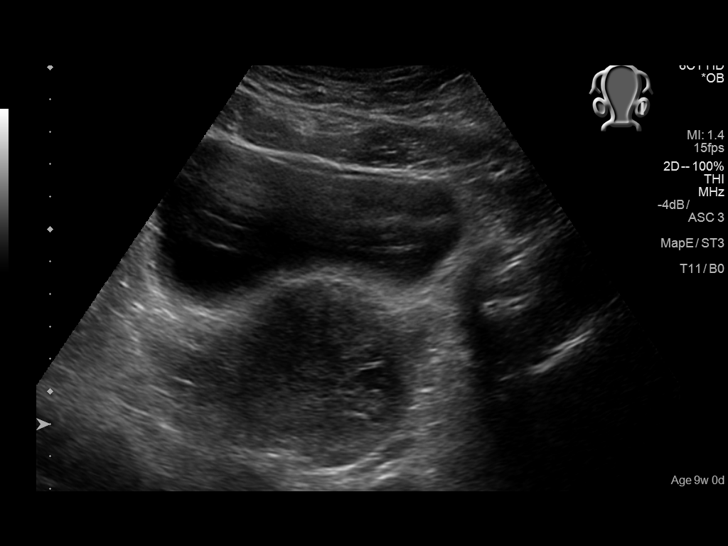
[im 36/89]
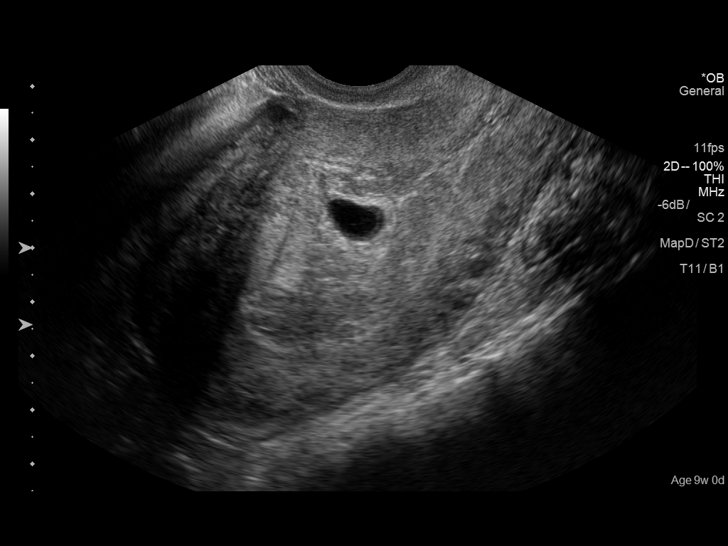
[im 46/89]
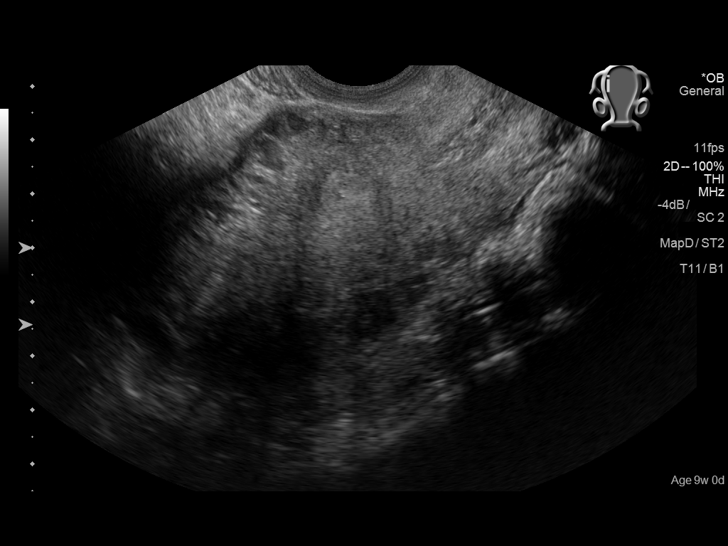
[im 53/89]
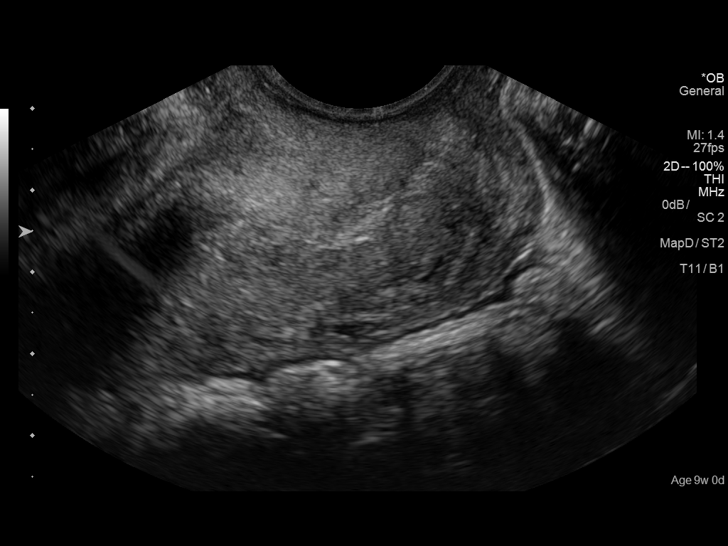
[im 59/89]
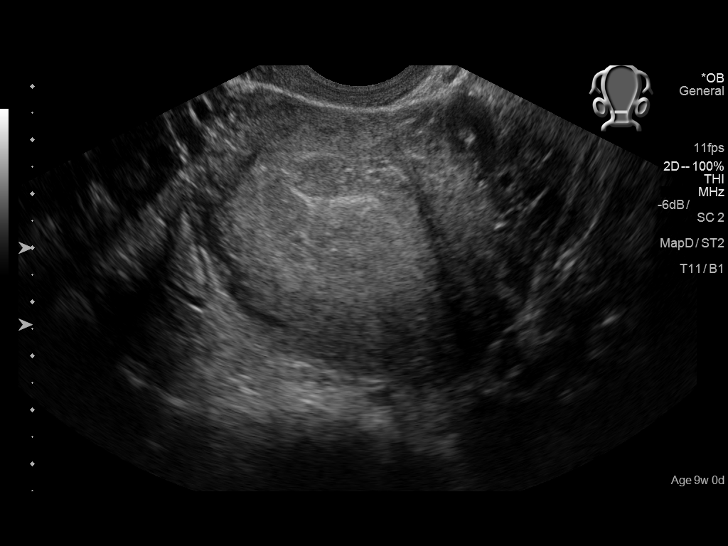
[im 66/89]
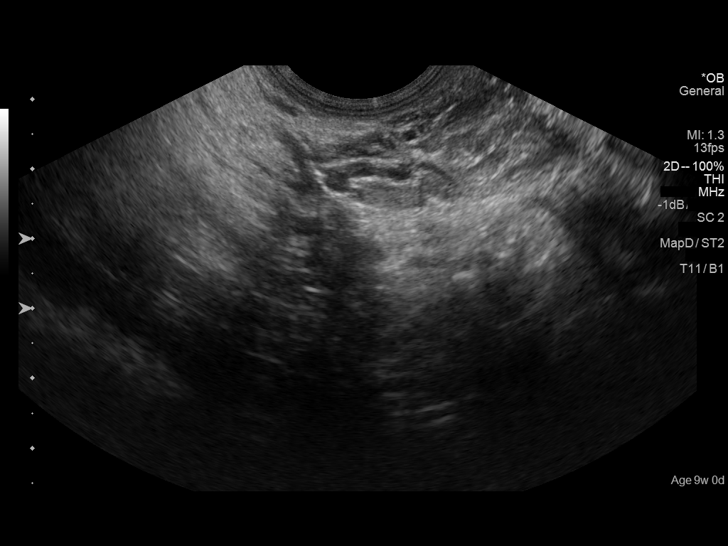
[im 72/89]
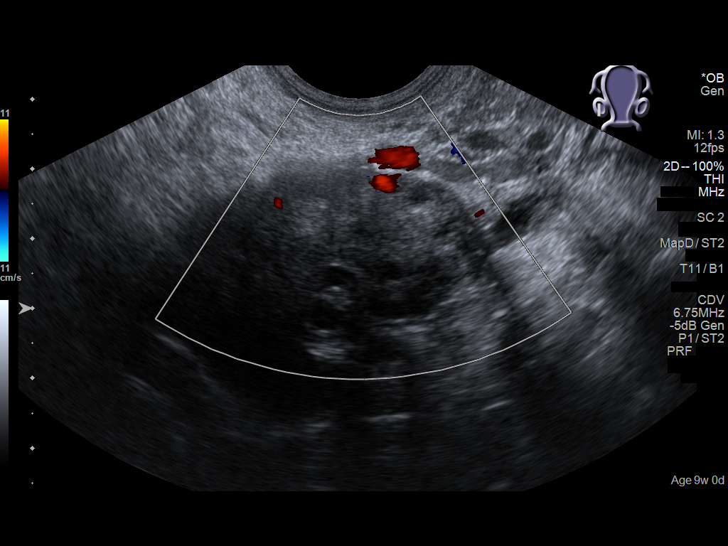
[im 79/89]
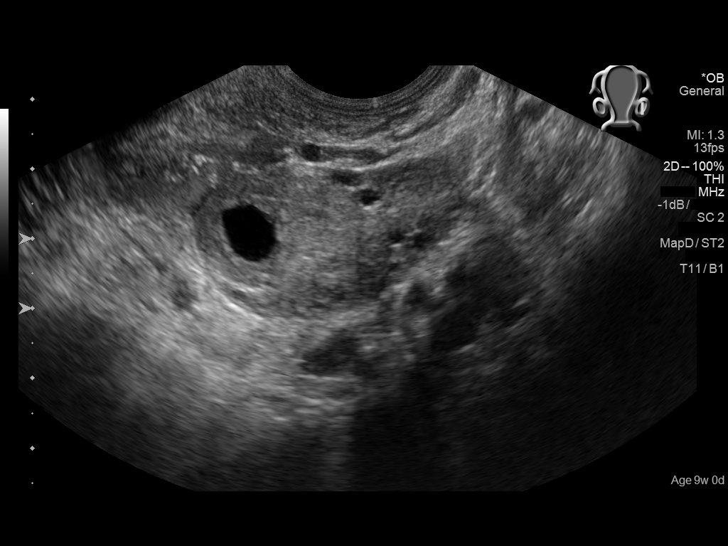
[im 85/89]
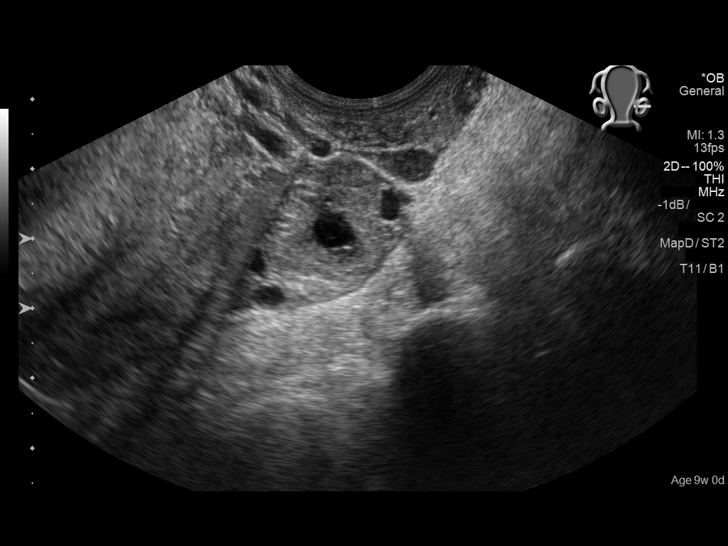

[13 of 28 positions shown; findings below may reference images not displayed]

FINDINGS: Intrauterine gestational sac: Present, located in the uterine body,
rather than the fundus.

Yolk sac:  Absent.

Embryo:  Absent.

Cardiac Activity: Absent.

MSD: 9.9  mm   5 w   5  d

Subchorionic hemorrhage:  None visualized.

Maternal uterus/adnexae: Probable corpus luteum cyst in the left
ovary. Right ovary is visualized. Endometrium appears somewhat
heterogeneous and thickened. Trace free fluid.
IMPRESSION: Gestational sac is present within the uterine body with minimal
increase in size from examination performed 6 days ago. No yolk sac
or embryo. Endometrial heterogeneity and thickening. No subchorionic
hemorrhage. While an early intrauterine pregnancy can have this
appearance, findings are worrisome for impending non viability.
Ectopic pregnancy is not excluded. Correlation with beta HCG and
short-term repeat ultrasound may be helpful in further evaluation.

## 2022-03-17 ENCOUNTER — Emergency Department
Admission: EM | Admit: 2022-03-17 | Discharge: 2022-03-17 | Disposition: A | Discharge status: Home / Self Care | Payer: Medicaid Other | Attending: Emergency Medicine | Admitting: Emergency Medicine

## 2022-03-17 ENCOUNTER — Other Ambulatory Visit: Payer: Self-pay

## 2022-03-17 DIAGNOSIS — J029 Acute pharyngitis, unspecified: Secondary | ICD-10-CM | POA: Diagnosis not present

## 2022-03-17 DIAGNOSIS — I10 Essential (primary) hypertension: Secondary | ICD-10-CM | POA: Diagnosis not present

## 2022-03-17 LAB — GROUP A STREP BY PCR: Group A Strep by PCR: NOT DETECTED

## 2022-03-17 MED ORDER — CEPHALEXIN 500 MG PO CAPS
500.0000 mg | ORAL_CAPSULE | Freq: Once | ORAL | Status: AC
  Administered 2022-03-17: 500 mg via ORAL
  Filled 2022-03-17: qty 1

## 2022-03-17 MED ORDER — CEPHALEXIN 500 MG PO CAPS: 500.0000 mg | ORAL_CAPSULE | Freq: Two times a day (BID) | ORAL | 0 refills | Status: AC

## 2022-03-17 NOTE — ED Provider Notes (Signed)
   [facility] Provider Note    Event Date/Time   First MD Initiated Contact with Patient 03/17/22 2229     (approximate)  History   Chief Complaint: Sore Throat  HPI  Crystal Patel- is a 31 y.o. female with a past medical history of hypertension presents to the emergency department for sore throat.  According to the patient for the past several days she has been experiencing sore throat nasal congestion.  Patient states now pain when she swallows so she came to the emergency department for evaluation.  Patient is status post tonsillectomy many years ago.  Patient states low-grade fever at home.  Physical Exam   Triage Vital Signs: ED Triage Vitals [03/17/22 2224]  Enc Vitals Group     BP 132/80     Pulse Rate 81     Resp 16     Temp 98.5 F (36.9 C)     Temp Source Oral     SpO2 100 %     Weight 220 lb (99.8 kg)     Height 5\' 7"  (1.702 m)     Head Circumference      Peak Flow      Pain Score 8     Pain Loc      Pain Edu?      Excl. in GC?     Most recent vital signs: Vitals:   03/17/22 2224  BP: 132/80  Pulse: 81  Resp: 16  Temp: 98.5 F (36.9 C)  SpO2: 100%    General: Awake, no distress.  CV:  Good peripheral perfusion.  Regular rate and rhythm  Resp:  Normal effort.  Equal breath sounds bilaterally.  Abd:  No distention.  Soft, nontender.  No rebound or guarding. Other:  Mild pharyngeal erythema bilaterally.  No obvious exudate.   ED Results / Procedures / Treatments   MEDICATIONS ORDERED IN ED: Medications - No data to display   IMPRESSION / MDM / ASSESSMENT AND PLAN / ED COURSE  I reviewed the triage vital signs and the nursing notes.  Patient presents to the emergency department with sore throat times several days along with nasal congestion and low-grade fever at home.  Patient does have mild cervical lymphadenopathy as well as mild pharyngeal erythema but no exudates.  We will check for strep.  As a precaution we  will cover with antibiotic patient will follow-up with her strep test on my chart.  Patient agreeable to plan of care.  I discussed supportive care as well with Tylenol or ibuprofen as well as Chloraseptic spray or lozenges.  FINAL CLINICAL IMPRESSION(S) / ED DIAGNOSES   Pharyngitis  Rx / DC Orders   Keflex  Note:  This document was prepared using Dragon voice recognition software and may include unintentional dictation errors.   03/19/22, MD 03/17/22 2241

## 2022-03-17 NOTE — ED Notes (Signed)
Pt A&O, discharge instructions given, pt ambulating with steady gait. ?

## 2022-03-17 NOTE — ED Triage Notes (Signed)
Pt states sore throat since Thursday. Pt states initially had a fever, pt is hoarse. Pt has no tonsils and appears in no acute distress.

## 2023-04-11 ENCOUNTER — Other Ambulatory Visit: Payer: Self-pay | Admitting: Family Medicine

## 2023-04-11 DIAGNOSIS — N926 Irregular menstruation, unspecified: Secondary | ICD-10-CM

## 2023-04-18 ENCOUNTER — Ambulatory Visit
Admission: RE | Admit: 2023-04-18 | Discharge: 2023-04-18 | Disposition: A | Discharge status: Home / Self Care | Payer: Medicaid Other | Source: Ambulatory Visit | Attending: Family Medicine | Admitting: Family Medicine

## 2023-04-18 DIAGNOSIS — N926 Irregular menstruation, unspecified: Secondary | ICD-10-CM | POA: Diagnosis present

## 2023-08-28 ENCOUNTER — Encounter: Payer: Self-pay | Admitting: Obstetrics and Gynecology

## 2023-08-28 ENCOUNTER — Ambulatory Visit (INDEPENDENT_AMBULATORY_CARE_PROVIDER_SITE_OTHER): Payer: Medicaid Other | Admitting: Obstetrics and Gynecology

## 2023-08-28 VITALS — BP 107/75 | HR 79 | Ht 68.0 in | Wt 232.0 lb

## 2023-08-28 DIAGNOSIS — E282 Polycystic ovarian syndrome: Secondary | ICD-10-CM | POA: Diagnosis not present

## 2023-08-28 DIAGNOSIS — N939 Abnormal uterine and vaginal bleeding, unspecified: Secondary | ICD-10-CM

## 2023-08-28 NOTE — Progress Notes (Signed)
Center, [name]   Chief Complaint  Patient presents with   Referral    PCOS. Discuss long cycles x 1 year    HPI:      [name]- is a 32 y.o. [id number] whose LMP was Patient's last menstrual period was 07/24/2023 (approximate)., presents today for NP eval for PCOS referred by PCP. Pt with hx of AUB a few times since 6/24. Had normal TSH/testosterone labs 10/24 but GYN u/s 6/24 with possible PCOS. Menses are usually monthly, occas Q2-3 months, lasting 5 days usually, mod flow, no BTB, mild dysmen. Had AUB 7/24 and 8/24. Menses started on time, stopped a few days, then restarted as light flow for the rest of the month until next menses started. Given provera 10 mg for 10 days by PCP that helped stopped bleeding. Pt had same sx again 10/24 and still bleeding now, although time for period again. No hirsutism. Hard to lose wt, does have pre-DM on recent labs with PCP. Hx of migraines, rarely with aura. No hx of HTN, DVTs. Did OCPs in adolescence.   She is sexually active, no pain/bleeding/dryness. Doesn't want to conceive but not interested in [facility]. Did nexplanon in past, removed ~2018. Last pap with PCP within the year, no hx of abn paps.    Patient Active Problem List   Diagnosis Date Noted   Encounter for induction of labor 05/17/2018   Fall at home 03/31/2018   Arm pain 02/14/2014    Past Surgical History:  Procedure Laterality Date   BLADDER SURGERY      History reviewed. No pertinent family history.   Social History   Socioeconomic History   Marital status: Married    Spouse name: Not on file   Number of children: Not on file   Years of education: Not on file   Highest education level: Not on file  Occupational History   Not on file  Tobacco Use   Smoking status: Never   Smokeless tobacco: Never  Vaping Use   Vaping status: Never Used  Substance and Sexual Activity   Alcohol use: No   Drug use: No   Sexual activity: Yes     Birth control/protection: None  Other Topics Concern   Not on file  Social History Narrative   Not on file   Social Determinants of Health   Financial Resource Strain: Not on file  Food Insecurity: Not on file  Transportation Needs: Not on file  Physical Activity: Not on file  Stress: Not on file  Social Connections: Unknown (03/20/2023)   Received from [facility], [facility]   Social Connections    Frequency of Communication with Friends and Family: Not asked    Frequency of Social Gatherings with Friends and Family: Not asked  Intimate Partner Violence: Unknown (03/20/2023)   Received from [facility], [facility]   Intimate Partner Violence    Fear of Current or Ex-Partner: Not asked    Emotionally Abused: Not asked    Physically Abused: Not asked    Sexually Abused: Not asked    Outpatient Medications Prior to Visit  Medication Sig Dispense Refill   benzocaine-Menthol (DERMOPLAST) 20-0.5 % AERO Apply 1 application topically as needed for irritation (perineal discomfort). 1 each 1   cephALEXin (KEFLEX) 500 MG capsule Take 1 capsule (500 mg total) by mouth 2 (two) times daily. 14 capsule 0   cetirizine (ZYRTEC) 10 MG tablet Take 1 tablet (10 mg total) by mouth  daily. (Patient not taking: Reported on 05/18/2018) 30 tablet 0   diphenhydrAMINE (BENADRYL) 25 mg capsule Take 2 capsules (50 mg total) by mouth every 6 (six) hours as needed. (Patient not taking: Reported on 05/18/2018) 60 capsule 0   etonogestrel (IMPLANON) 68 MG IMPL implant Inject 1 each into the skin once.     fluticasone (FLONASE) 50 MCG/ACT nasal spray Place 1 spray into both nostrils 2 (two) times daily. (Patient not taking: Reported on 05/18/2018) 16 g 0   ibuprofen (ADVIL,MOTRIN) 600 MG tablet Take 1 tablet (600 mg total) by mouth every 6 (six) hours. 30 tablet 0   meclizine (ANTIVERT) 32 MG tablet Take 1 tablet (32 mg total) by mouth 3 (three) times daily as needed. (Patient not taking: Reported on  05/18/2018) 15 tablet 0   metoCLOPramide (REGLAN) 10 MG tablet Take 1 tablet (10 mg total) by mouth every 6 (six) hours as needed. (Patient not taking: Reported on 05/18/2018) 30 tablet 0   ondansetron (ZOFRAN ODT) 4 MG disintegrating tablet Take 1 tablet (4 mg total) by mouth every 8 (eight) hours as needed for nausea or vomiting. (Patient not taking: Reported on 05/18/2018) 20 tablet 0   Prenatal Vit-Fe Fumarate-FA (PRENATAL MULTIVITAMIN) TABS tablet Take 1 tablet by mouth daily at 12 noon.     No facility-administered medications prior to visit.      ROS:  Review of Systems  Constitutional:  Negative for fever.  Gastrointestinal:  Negative for blood in stool, constipation, diarrhea, nausea and vomiting.  Genitourinary:  Positive for menstrual problem. Negative for dyspareunia, dysuria, flank pain, frequency, hematuria, urgency, vaginal bleeding, vaginal discharge and vaginal pain.  Musculoskeletal:  Negative for back pain.  Skin:  Negative for rash.   BREAST: No symptoms   OBJECTIVE:   Vitals:  BP 107/75   Pulse 79   Ht 5\' 8"  (1.727 m)   Wt 232 lb (105.2 kg)   LMP 07/24/2023 (Approximate)   Breastfeeding No   BMI 35.28 kg/m   Physical Exam Vitals reviewed.  Constitutional:      Appearance: She is well-developed.  Pulmonary:     Effort: Pulmonary effort is normal.  Musculoskeletal:        General: Normal range of motion.     Cervical back: Normal range of motion.  Skin:    General: Skin is warm and dry.  Neurological:     General: No focal deficit present.     Mental Status: She is alert and oriented to person, place, and time.     Cranial Nerves: No cranial nerve deficit.  Psychiatric:        Mood and Affect: Mood normal.        Behavior: Behavior normal.        Thought Content: Thought content normal.        Judgment: Judgment normal.    Assessment/Plan: PCOS (polycystic ovarian syndrome) - Plan: FSH/LH, Estradiol, DHEA-sulfate, Androstenedione, Progesterone,  17-Hydroxyprogesterone; check more labs. Will f/u with results. GYN u/s with possible PCOS but doesn't have oligomenorrhea/hirsutism.   Abnormal uterine bleeding (AUB) - Plan: Prolactin, FSH/LH, Estradiol, DHEA-sulfate, Androstenedione, Progesterone, 17-Hydroxyprogesterone; check labs. Will f/u with results and to see if bleeding (menses due now) has stopped. If not, will try POPs a few months to see if resets cycle. Pt with hx of infrequent migraine with aura.     Return if symptoms worsen or fail to improve.  Meilyn Heindl B. Kaiyan Luczak, PA-C 08/28/2023 5:41 PM

## 2023-08-28 NOTE — Patient Instructions (Signed)
I value your feedback and you entrusting us with your care. If you get a [facility] patient survey, I would appreciate you taking the time to let us know about your experience today. Thank you! ? ? ?

## 2023-09-03 ENCOUNTER — Telehealth: Payer: Self-pay

## 2023-09-03 LAB — ESTRADIOL: Estradiol: 44.3 pg/mL

## 2023-09-03 LAB — DHEA-SULFATE: DHEA-SO4: 387 ug/dL — ABNORMAL HIGH (ref 84.8–378.0)

## 2023-09-03 LAB — 17-HYDROXYPROGESTERONE: 17-OH Progesterone LCMS: 34 ng/dL

## 2023-09-03 LAB — FSH/LH
FSH: 6.7 m[IU]/mL
LH: 8.8 m[IU]/mL

## 2023-09-03 LAB — ANDROSTENEDIONE: Androstenedione LCMS: 127 ng/dL (ref 41–262)

## 2023-09-03 LAB — PROLACTIN: Prolactin: 9.6 ng/mL (ref 4.8–33.4)

## 2023-09-03 LAB — PROGESTERONE: Progesterone: 0.1 ng/mL

## 2023-09-03 NOTE — Telephone Encounter (Signed)
Pt aware.

## 2023-09-03 NOTE — Telephone Encounter (Signed)
Pt calling for results from Thurs; isn't sure she is reading them correctly.  [phone]  Unable to reach pt to let her know ABC is not in today but message will be sent to her

## 2023-09-03 NOTE — Telephone Encounter (Signed)
The patient returning missed call. Please advise? [phone]

## 2023-09-04 MED ORDER — NORETHINDRONE 0.35 MG PO TABS: 1.0000 | ORAL_TABLET | Freq: Every day | ORAL | 0 refills | Status: AC

## 2023-09-04 NOTE — Addendum Note (Signed)
Addended by: Althea Grimmer B on: 09/04/2023 01:18 PM   Modules accepted: Orders

## 2023-09-04 NOTE — Telephone Encounter (Signed)
 Called pt, discussed results.

## 2023-09-22 NOTE — Telephone Encounter (Signed)
Patient report she started the micronor 09/10/23. She is still bleeding. She states that it is starting to slow up now but it is still pretty heavy. She reports she is using a pad and a tampon and changing 4- 5 times per day.

## 2023-09-22 NOTE — Telephone Encounter (Signed)
Normal to have BTB with OCPs in general. Needs to give it more time. Reassurance.

## 2023-09-23 NOTE — Telephone Encounter (Signed)
Pt aware of ABC's msg.  Is aware that it takes a good three months for the body to adjust to bc.  As long as she isn't soaking a pad q58min-1hr she's okay.  Pt voiced understanding.

## 2023-11-19 ENCOUNTER — Other Ambulatory Visit: Payer: Self-pay | Admitting: Obstetrics and Gynecology

## 2023-11-19 DIAGNOSIS — N939 Abnormal uterine and vaginal bleeding, unspecified: Secondary | ICD-10-CM
